# Patient Record
Sex: Female | Born: 1980 | Race: Black or African American | Hispanic: No | Marital: Single | Smoking: Former smoker
Health system: Southern US, Community
[De-identification: ages and names within clinical notes are randomized; demographics above are authoritative.]

## PROBLEM LIST (undated history)

## (undated) ENCOUNTER — Inpatient Hospital Stay (HOSPITAL_COMMUNITY): Payer: Self-pay

## (undated) DIAGNOSIS — R87629 Unspecified abnormal cytological findings in specimens from vagina: Secondary | ICD-10-CM

## (undated) HISTORY — PX: NO PAST SURGERIES: SHX2092

---

## 2002-12-16 ENCOUNTER — Encounter: Payer: Self-pay | Admitting: Emergency Medicine

## 2002-12-16 ENCOUNTER — Emergency Department (HOSPITAL_COMMUNITY): Admission: EM | Admit: 2002-12-16 | Discharge: 2002-12-16 | Payer: Self-pay | Admitting: Emergency Medicine

## 2003-08-19 ENCOUNTER — Encounter: Payer: Self-pay | Admitting: Emergency Medicine

## 2003-08-19 ENCOUNTER — Emergency Department (HOSPITAL_COMMUNITY): Admission: EM | Admit: 2003-08-19 | Discharge: 2003-08-19 | Payer: Self-pay | Admitting: Emergency Medicine

## 2005-01-24 ENCOUNTER — Emergency Department (HOSPITAL_COMMUNITY): Admission: EM | Admit: 2005-01-24 | Discharge: 2005-01-24 | Payer: Self-pay | Admitting: Emergency Medicine

## 2010-11-29 ENCOUNTER — Emergency Department (HOSPITAL_COMMUNITY)
Admission: EM | Admit: 2010-11-29 | Discharge: 2010-11-30 | Payer: Self-pay | Source: Home / Self Care | Admitting: Emergency Medicine

## 2011-06-01 ENCOUNTER — Emergency Department (HOSPITAL_COMMUNITY)
Admission: EM | Admit: 2011-06-01 | Discharge: 2011-06-01 | Discharge status: Against Medical Advice | Payer: Self-pay | Attending: Emergency Medicine | Admitting: Emergency Medicine

## 2013-02-12 LAB — OB RESULTS CONSOLE GBS: GBS: NEGATIVE

## 2013-08-05 LAB — OB RESULTS CONSOLE RPR: RPR: NONREACTIVE

## 2013-08-05 LAB — OB RESULTS CONSOLE ABO/RH: RH Type: POSITIVE

## 2013-08-05 LAB — OB RESULTS CONSOLE GC/CHLAMYDIA
Chlamydia: NEGATIVE
Gonorrhea: NEGATIVE

## 2013-08-05 LAB — OB RESULTS CONSOLE HIV ANTIBODY (ROUTINE TESTING): HIV: NONREACTIVE

## 2013-08-05 LAB — OB RESULTS CONSOLE ANTIBODY SCREEN: Antibody Screen: NEGATIVE

## 2013-08-05 LAB — OB RESULTS CONSOLE HEPATITIS B SURFACE ANTIGEN: Hepatitis B Surface Ag: NEGATIVE

## 2013-08-05 LAB — OB RESULTS CONSOLE RUBELLA ANTIBODY, IGM: Rubella: IMMUNE

## 2013-10-31 NOTE — L&D Delivery Note (Signed)
Delivery Note At 9:09 PM a viable and healthy female was delivered via Vaginal, Spontaneous Delivery (Presentation: Left Occiput Anterior).  APGAR: 8, 9; weight 7 lb 14.3 oz (3580 g).   Placenta status: Intact, Spontaneous.  Not sent Cord: 3 vessels with the following complications: None.  Cord pH: none  Anesthesia: Epidural Local  Episiotomy: None Lacerations: 1st degree;Perineal Suture Repair: 3.0 chromic Est. Blood Loss (mL): 350  Mom to postpartum.  Baby to Couplet care / Skin to Skin.  Keandria Berrocal A Darris Carachure 03/03/2014, 10:03 PM

## 2013-11-30 ENCOUNTER — Inpatient Hospital Stay (HOSPITAL_COMMUNITY)
Admission: AD | Admit: 2013-11-30 | Discharge: 2013-12-01 | Disposition: A | Discharge status: Home / Self Care | Payer: BC Managed Care – PPO | Source: Ambulatory Visit | Attending: Obstetrics and Gynecology | Admitting: Obstetrics and Gynecology

## 2013-11-30 ENCOUNTER — Encounter (HOSPITAL_COMMUNITY): Payer: Self-pay | Admitting: *Deleted

## 2013-11-30 DIAGNOSIS — R110 Nausea: Secondary | ICD-10-CM | POA: Insufficient documentation

## 2013-11-30 DIAGNOSIS — O99891 Other specified diseases and conditions complicating pregnancy: Secondary | ICD-10-CM | POA: Insufficient documentation

## 2013-11-30 DIAGNOSIS — R109 Unspecified abdominal pain: Secondary | ICD-10-CM | POA: Insufficient documentation

## 2013-11-30 DIAGNOSIS — O9989 Other specified diseases and conditions complicating pregnancy, childbirth and the puerperium: Principal | ICD-10-CM

## 2013-11-30 DIAGNOSIS — O26899 Other specified pregnancy related conditions, unspecified trimester: Secondary | ICD-10-CM

## 2013-11-30 HISTORY — DX: Unspecified abnormal cytological findings in specimens from vagina: R87.629

## 2013-11-30 LAB — CBC
HCT: 30.6 % — ABNORMAL LOW (ref 36.0–46.0)
Hemoglobin: 10.8 g/dL — ABNORMAL LOW (ref 12.0–15.0)
MCH: 34.5 pg — ABNORMAL HIGH (ref 26.0–34.0)
MCHC: 35.3 g/dL (ref 30.0–36.0)
MCV: 97.8 fL (ref 78.0–100.0)
Platelets: 286 10*3/uL (ref 150–400)
RBC: 3.13 MIL/uL — ABNORMAL LOW (ref 3.87–5.11)
RDW: 14.5 % (ref 11.5–15.5)
WBC: 10.8 10*3/uL — ABNORMAL HIGH (ref 4.0–10.5)

## 2013-11-30 LAB — URINALYSIS, ROUTINE W REFLEX MICROSCOPIC
Bilirubin Urine: NEGATIVE
Glucose, UA: NEGATIVE mg/dL
Hgb urine dipstick: NEGATIVE
Ketones, ur: NEGATIVE mg/dL
Leukocytes, UA: NEGATIVE
Nitrite: NEGATIVE
Protein, ur: NEGATIVE mg/dL
Specific Gravity, Urine: 1.02 (ref 1.005–1.030)
Urobilinogen, UA: 0.2 mg/dL (ref 0.0–1.0)
pH: 7 (ref 5.0–8.0)

## 2013-11-30 LAB — COMPREHENSIVE METABOLIC PANEL
ALT: 25 U/L (ref 0–35)
AST: 22 U/L (ref 0–37)
Albumin: 2.7 g/dL — ABNORMAL LOW (ref 3.5–5.2)
Alkaline Phosphatase: 73 U/L (ref 39–117)
BUN: 9 mg/dL (ref 6–23)
CO2: 22 mEq/L (ref 19–32)
Calcium: 8.6 mg/dL (ref 8.4–10.5)
Chloride: 103 mEq/L (ref 96–112)
Creatinine, Ser: 0.61 mg/dL (ref 0.50–1.10)
GFR calc Af Amer: >90 mL/min (ref >90)
GFR calc non Af Amer: >90 mL/min (ref >90)
Glucose, Bld: 101 mg/dL — ABNORMAL HIGH (ref 70–99)
Potassium: 4 mEq/L (ref 3.7–5.3)
Sodium: 138 mEq/L (ref 137–147)
Total Bilirubin: <0.2 mg/dL — ABNORMAL LOW (ref 0.3–1.2)
Total Protein: 5.9 g/dL — ABNORMAL LOW (ref 6.0–8.3)

## 2013-11-30 LAB — WET PREP, GENITAL
Trich, Wet Prep: NONE SEEN
Yeast Wet Prep HPF POC: NONE SEEN

## 2013-11-30 MED ORDER — GI COCKTAIL ~~LOC~~
30.0000 mL | Freq: Once | ORAL | Status: AC
  Administered 2013-11-30: 30 mL via ORAL
  Filled 2013-11-30: qty 30

## 2013-11-30 NOTE — MAU Note (Signed)
Pt c/o abd pain from umbilicus up. Pain has been constant for most of the day but now is intermittent. Reports she had had a BM (formed 3-4 times today..Marland Kitchen

## 2013-11-30 NOTE — MAU Provider Note (Signed)
History     CSN: [id number]  Arrival date and time: 11/30/13 2026 Provider notified: 2123 Orders given: 2205 Provider on unit: 2215 Provider at bedside: 2220   Chief Complaint  Patient presents with  . Abdominal Pain   HPI  Ms. Jennifer [name] is a 33 yo G5P3013 at 25.[redacted]wks gestation presenting with complaints of generalized abd pain, nausea and BM x 3 today; which is unusual for her.  She denies diarrhea.  Denies VB or LOF.  Reports  cottage cheese-like vaginal d/c yesterday. No vaginal itching, burning or odor.  (+) FM.  Past Medical History  Diagnosis Date  . Medical history non-contributory   . Vaginal Pap smear, abnormal     Past Surgical History  Procedure Laterality Date  . No past surgeries      No family history on file.  History  Substance Use Topics  . Smoking status: Light Tobacco Smoker  . Smokeless tobacco: Not on file  . Alcohol Use: No    Allergies: No Known Allergies  Prescriptions prior to admission  Medication Sig Dispense Refill  . Prenatal Vit-Fe Fumarate-FA (PRENATAL MULTIVITAMIN) TABS tablet Take 1 tablet by mouth daily at 12 noon.      . promethazine (PHENERGAN) 25 MG tablet Take 25 mg by mouth every 6 (six) hours as needed for nausea or vomiting.        Review of Systems  Constitutional: Negative.   HENT: Negative.   Eyes: Negative.   Respiratory: Negative.   Cardiovascular: Negative.   Gastrointestinal: Positive for nausea and abdominal pain.       BM x 3 episodes today, no diarrhea  Genitourinary: Negative.   Musculoskeletal: Negative.   Skin: Negative.   Neurological: Negative.   Endo/Heme/Allergies: Negative.   Psychiatric/Behavioral: Negative.    Results for orders placed during the hospital encounter of 11/30/13 (from the past 24 hour(s))  URINALYSIS, ROUTINE W REFLEX MICROSCOPIC     Status: None   Collection Time    11/30/13  8:50 PM      Result Value Range   Color, Urine YELLOW  YELLOW   APPearance CLEAR  CLEAR   Specific Gravity, Urine 1.020  1.005 - 1.030   pH 7.0  5.0 - 8.0   Glucose, UA NEGATIVE  NEGATIVE mg/dL   Hgb urine dipstick NEGATIVE  NEGATIVE   Bilirubin Urine NEGATIVE  NEGATIVE   Ketones, ur NEGATIVE  NEGATIVE mg/dL   Protein, ur NEGATIVE  NEGATIVE mg/dL   Urobilinogen, UA 0.2  0.0 - 1.0 mg/dL   Nitrite NEGATIVE  NEGATIVE   Leukocytes, UA NEGATIVE  NEGATIVE  CBC     Status: Abnormal   Collection Time    11/30/13 10:45 PM      Result Value Range   WBC 10.8 (*) 4.0 - 10.5 K/uL   RBC 3.13 (*) 3.87 - 5.11 MIL/uL   Hemoglobin 10.8 (*) 12.0 - 15.0 g/dL   HCT 40.930.6 (*) 81.136.0 - 91.446.0 %   MCV 97.8  78.0 - 100.0 fL   MCH 34.5 (*) 26.0 - 34.0 pg   MCHC 35.3  30.0 - 36.0 g/dL   RDW 78.214.5  95.611.5 - 21.315.5 %   Platelets 286  150 - 400 K/uL  COMPREHENSIVE METABOLIC PANEL     Status: Abnormal   Collection Time    11/30/13 10:45 PM      Result Value Range   Sodium 138  137 - 147 mEq/L   Potassium 4.0  3.7 - 5.3 mEq/L  Chloride 103  96 - 112 mEq/L   CO2 22  19 - 32 mEq/L   Glucose, Bld 101 (*) 70 - 99 mg/dL   BUN 9  6 - 23 mg/dL   Creatinine, Ser 6.96  0.50 - 1.10 mg/dL   Calcium 8.6  8.4 - 29.5 mg/dL   Total Protein 5.9 (*) 6.0 - 8.3 g/dL   Albumin 2.7 (*) 3.5 - 5.2 g/dL   AST 22  0 - 37 U/L   ALT 25  0 - 35 U/L   Alkaline Phosphatase 73  39 - 117 U/L   Total Bilirubin <0.2 (*) 0.3 - 1.2 mg/dL   GFR calc non Af Amer >90  >90 mL/min   GFR calc Af Amer >90  >90 mL/min  WET PREP, GENITAL     Status: Abnormal   Collection Time    11/30/13 10:50 PM      Result Value Range   Yeast Wet Prep HPF POC NONE SEEN  NONE SEEN   Trich, Wet Prep NONE SEEN  NONE SEEN   Clue Cells Wet Prep HPF POC FEW (*) NONE SEEN   WBC, Wet Prep HPF POC FEW (*) NONE SEEN   Physical Exam   Blood pressure 115/60, pulse 95, temperature 98.6 F (37 C), temperature source Oral, resp. rate 18, height 5\' 1"  (1.549 m), weight 84.369 kg (186 lb).  Physical Exam  Constitutional: She is oriented to person, place, and  time. She appears well-developed and well-nourished.  HENT:  Head: Normocephalic and atraumatic.  Eyes: Pupils are equal, round, and reactive to light.  Neck: Normal range of motion. Neck supple.  Cardiovascular: Normal rate, regular rhythm and normal heart sounds.   Respiratory: Effort normal and breath sounds normal.  GI: Soft. Bowel sounds are normal.  Genitourinary: Vagina normal and uterus normal.  Scant white vaginal d/c noted on SSE  Musculoskeletal: Normal range of motion.  Neurological: She is alert and oriented to person, place, and time. She has normal reflexes.  Skin: Skin is warm and dry.  Psychiatric: She has a normal mood and affect. Her behavior is normal. Judgment and thought content normal.  VE: closed/thick/high/posterior  MAU Course  Procedures CCUA CBC CMET GI Cocktail Wet Prep  Assessment and Plan  33 yo IUP @ 25.2wks Abdominal Pain Nausea in Pregnancy Reassuring FHR tracing - appropriate for gestational age Normal CBC, CMET and wet prep Discharge Home Take Phenergan tonight If symptoms worsen or not improved, call the office during business hours  *Dr. Cherly Hensen notified of plan - agrees with plan   Kenard Gower, MSN, CNM 11/30/2013, 11:00 PM

## 2013-12-01 NOTE — Progress Notes (Signed)
Rolitta Dawson CNM in earlier to discuss test results and d/c plan. Written and verbal d/c instructions given and understanding voiced. 

## 2013-12-01 NOTE — Discharge Instructions (Signed)
Maintain good daily hydration.  Eat small, frequent meals every 2-3 hrs.  Take Phenergan tonight to help with nausea and sleep tonight.  If your symptoms have not improved by next week, you will need to call the office during office hours to get a referral to a GI doctor.  Morning Sickness Morning sickness is when you feel sick to your stomach (nauseous) during pregnancy. This nauseous feeling may or may not come with vomiting. It often occurs in the morning but can be a problem any time of day. Morning sickness is most common during the first trimester, but it may continue throughout pregnancy. While morning sickness is unpleasant, it is usually harmless unless you develop severe and continual vomiting (hyperemesis gravidarum). This condition requires more intense treatment.  CAUSES  The cause of morning sickness is not completely known but seems to be related to normal hormonal changes that occur in pregnancy. RISK FACTORS You are at greater risk if you:  Experienced nausea or vomiting before your pregnancy.  Had morning sickness during a previous pregnancy.  Are pregnant with more than one baby, such as twins. TREATMENT  Do not use any medicines (prescription, over-the-counter, or herbal) for morning sickness without first talking to your health care provider. Your health care provider may prescribe or recommend:  Vitamin B6 supplements.  Anti-nausea medicines.  The herbal medicine ginger. HOME CARE INSTRUCTIONS   Only take over-the-counter or prescription medicines as directed by your health care provider.  Taking multivitamins before getting pregnant can prevent or decrease the severity of morning sickness in most women.   Eat a piece of dry toast or unsalted crackers before getting out of bed in the morning.   Eat five or six small meals a day.   Eat dry and bland foods (rice, baked potato). Foods high in carbohydrates are often helpful.  Do not drink liquids with your  meals. Drink liquids between meals.   Avoid greasy, fatty, and spicy foods.   Get someone to cook for you if the smell of any food causes nausea and vomiting.   If you feel nauseous after taking prenatal vitamins, take the vitamins at night or with a snack.  Snack on protein foods (nuts, yogurt, cheese) between meals if you are hungry.   Eat unsweetened gelatins for desserts.   Wearing an acupressure wristband (worn for sea sickness) may be helpful.   Acupuncture may be helpful.   Do not smoke.   Get a humidifier to keep the air in your house free of odors.   Get plenty of fresh air. SEEK MEDICAL CARE IF:   Your home remedies are not working, and you need medicine.  You feel dizzy or lightheaded.  You are losing weight. SEEK IMMEDIATE MEDICAL CARE IF:   You have persistent and uncontrolled nausea and vomiting.  You pass out (faint). Document Released: 12/08/2006 Document Revised: 06/19/2013 Document Reviewed: 04/03/2013 [facility] Patient Information 2014 [place], [place]. Abdominal Pain During Pregnancy Abdominal pain is common in pregnancy. Most of the time, it does not cause harm. There are many causes of abdominal pain. Some causes are more serious than others. Some of the causes of abdominal pain in pregnancy are easily diagnosed. Occasionally, the diagnosis takes time to understand. Other times, the cause is not determined. Abdominal pain can be a sign that something is very wrong with the pregnancy, or the pain may have nothing to do with the pregnancy at all. For this reason, always tell your health care provider if you  have any abdominal discomfort. HOME CARE INSTRUCTIONS  Monitor your abdominal pain for any changes. The following actions may help to alleviate any discomfort you are experiencing:  Do not have sexual intercourse or put anything in your vagina until your symptoms go away completely.  Get plenty of rest until your pain improves.  Drink clear  fluids if you feel nauseous. Avoid solid food as long as you are uncomfortable or nauseous.  Only take over-the-counter or prescription medicine as directed by your health care provider.  Keep all follow-up appointments with your health care provider. SEEK IMMEDIATE MEDICAL CARE IF:  You are bleeding, leaking fluid, or passing tissue from the vagina.  You have increasing pain or cramping.  You have persistent vomiting.  You have painful or bloody urination.  You have a fever.  You notice a decrease in your baby's movements.  You have extreme weakness or feel faint.  You have shortness of breath, with or without abdominal pain.  You develop a severe headache with abdominal pain.  You have abnormal vaginal discharge with abdominal pain.  You have persistent diarrhea.  You have abdominal pain that continues even after rest, or gets worse. MAKE SURE YOU:   Understand these instructions.  Will watch your condition.  Will get help right away if you are not doing well or get worse. Document Released: 10/17/2005 Document Revised: 08/07/2013 Document Reviewed: 05/16/2013 [facility] Patient Information 2014 [place].

## 2014-02-12 LAB — OB RESULTS CONSOLE GBS: GBS: NEGATIVE

## 2014-02-20 ENCOUNTER — Other Ambulatory Visit: Payer: Self-pay | Admitting: Obstetrics and Gynecology

## 2014-02-25 ENCOUNTER — Telehealth (HOSPITAL_COMMUNITY): Payer: Self-pay | Admitting: *Deleted

## 2014-02-25 ENCOUNTER — Encounter (HOSPITAL_COMMUNITY): Payer: Self-pay | Admitting: *Deleted

## 2014-02-25 NOTE — Telephone Encounter (Signed)
Preadmission screen  

## 2014-03-03 ENCOUNTER — Encounter (HOSPITAL_COMMUNITY): Payer: Self-pay | Admitting: *Deleted

## 2014-03-03 ENCOUNTER — Encounter (HOSPITAL_COMMUNITY): Payer: BC Managed Care – PPO | Admitting: Anesthesiology

## 2014-03-03 ENCOUNTER — Inpatient Hospital Stay (HOSPITAL_COMMUNITY)
Admission: AD | Admit: 2014-03-03 | Discharge: 2014-03-05 | DRG: 767 | Disposition: A | Discharge status: Home / Self Care | Payer: BC Managed Care – PPO | Source: Ambulatory Visit | Attending: Obstetrics & Gynecology | Admitting: Obstetrics & Gynecology

## 2014-03-03 ENCOUNTER — Inpatient Hospital Stay (HOSPITAL_COMMUNITY): Payer: BC Managed Care – PPO | Admitting: Anesthesiology

## 2014-03-03 DIAGNOSIS — Z9889 Other specified postprocedural states: Secondary | ICD-10-CM

## 2014-03-03 DIAGNOSIS — O09529 Supervision of elderly multigravida, unspecified trimester: Secondary | ICD-10-CM | POA: Diagnosis present

## 2014-03-03 DIAGNOSIS — E669 Obesity, unspecified: Secondary | ICD-10-CM | POA: Diagnosis present

## 2014-03-03 DIAGNOSIS — Z6837 Body mass index (BMI) 37.0-37.9, adult: Secondary | ICD-10-CM

## 2014-03-03 DIAGNOSIS — Z87891 Personal history of nicotine dependence: Secondary | ICD-10-CM

## 2014-03-03 DIAGNOSIS — Z823 Family history of stroke: Secondary | ICD-10-CM

## 2014-03-03 DIAGNOSIS — Z8249 Family history of ischemic heart disease and other diseases of the circulatory system: Secondary | ICD-10-CM

## 2014-03-03 DIAGNOSIS — Z302 Encounter for sterilization: Secondary | ICD-10-CM

## 2014-03-03 DIAGNOSIS — O99214 Obesity complicating childbirth: Secondary | ICD-10-CM

## 2014-03-03 LAB — RPR

## 2014-03-03 LAB — ABO/RH: ABO/RH(D): A POS

## 2014-03-03 LAB — CBC
HCT: 36.3 % (ref 36.0–46.0)
Hemoglobin: 12.8 g/dL (ref 12.0–15.0)
MCH: 34.5 pg — ABNORMAL HIGH (ref 26.0–34.0)
MCHC: 35.3 g/dL (ref 30.0–36.0)
MCV: 97.8 fL (ref 78.0–100.0)
Platelets: 248 10*3/uL (ref 150–400)
RBC: 3.71 MIL/uL — ABNORMAL LOW (ref 3.87–5.11)
RDW: 15.1 % (ref 11.5–15.5)
WBC: 8.2 10*3/uL (ref 4.0–10.5)

## 2014-03-03 LAB — TYPE AND SCREEN
ABO/RH(D): A POS
Antibody Screen: NEGATIVE

## 2014-03-03 MED ORDER — FERROUS SULFATE 325 (65 FE) MG PO TABS
325.0000 mg | ORAL_TABLET | Freq: Two times a day (BID) | ORAL | Status: DC
  Administered 2014-03-04 (×2): 325 mg via ORAL
  Filled 2014-03-03 (×2): qty 1

## 2014-03-03 MED ORDER — ACETAMINOPHEN 325 MG PO TABS: 650.0000 mg | ORAL_TABLET | ORAL | Status: DC | PRN

## 2014-03-03 MED ORDER — FENTANYL 2.5 MCG/ML BUPIVACAINE 1/10 % EPIDURAL INFUSION (WH - ANES)
14.0000 mL/h | INTRAMUSCULAR | Status: DC | PRN
  Administered 2014-03-03 (×2): 14 mL/h via EPIDURAL
  Filled 2014-03-03: qty 125

## 2014-03-03 MED ORDER — SIMETHICONE 80 MG PO CHEW: 80.0000 mg | CHEWABLE_TABLET | ORAL | Status: DC | PRN

## 2014-03-03 MED ORDER — LIDOCAINE HCL (PF) 1 % IJ SOLN
30.0000 mL | INTRAMUSCULAR | Status: DC | PRN
  Administered 2014-03-03: 30 mL via SUBCUTANEOUS
  Filled 2014-03-03 (×2): qty 30

## 2014-03-03 MED ORDER — FENTANYL 2.5 MCG/ML BUPIVACAINE 1/10 % EPIDURAL INFUSION (WH - ANES): 14.0000 mL/h | INTRAMUSCULAR | Status: DC | PRN

## 2014-03-03 MED ORDER — PHENYLEPHRINE 40 MCG/ML (10ML) SYRINGE FOR IV PUSH (FOR BLOOD PRESSURE SUPPORT)
80.0000 ug | PREFILLED_SYRINGE | INTRAVENOUS | Status: DC | PRN
  Administered 2014-03-03: 80 ug via INTRAVENOUS
  Filled 2014-03-03: qty 10
  Filled 2014-03-03: qty 2

## 2014-03-03 MED ORDER — OXYCODONE-ACETAMINOPHEN 5-325 MG PO TABS: 1.0000 | ORAL_TABLET | ORAL | Status: DC | PRN

## 2014-03-03 MED ORDER — SENNOSIDES-DOCUSATE SODIUM 8.6-50 MG PO TABS
2.0000 | ORAL_TABLET | ORAL | Status: DC
  Administered 2014-03-04 – 2014-03-05 (×2): 2 via ORAL
  Filled 2014-03-03 (×2): qty 2

## 2014-03-03 MED ORDER — CITRIC ACID-SODIUM CITRATE 334-500 MG/5ML PO SOLN: 30.0000 mL | ORAL | Status: DC | PRN

## 2014-03-03 MED ORDER — EPHEDRINE 5 MG/ML INJ
10.0000 mg | INTRAVENOUS | Status: DC | PRN
  Filled 2014-03-03: qty 2

## 2014-03-03 MED ORDER — PRENATAL MULTIVITAMIN CH: 1.0000 | ORAL_TABLET | Freq: Every day | ORAL | Status: DC

## 2014-03-03 MED ORDER — SODIUM BICARBONATE 8.4 % IV SOLN
INTRAVENOUS | Status: DC | PRN
  Administered 2014-03-03: 5 mL via EPIDURAL

## 2014-03-03 MED ORDER — TERBUTALINE SULFATE 1 MG/ML IJ SOLN: 0.2500 mg | Freq: Once | INTRAMUSCULAR | Status: DC | PRN

## 2014-03-03 MED ORDER — WITCH HAZEL-GLYCERIN EX PADS: 1.0000 "application " | MEDICATED_PAD | CUTANEOUS | Status: DC | PRN

## 2014-03-03 MED ORDER — EPHEDRINE 5 MG/ML INJ
INTRAVENOUS | Status: AC
  Filled 2014-03-03: qty 4

## 2014-03-03 MED ORDER — IBUPROFEN 600 MG PO TABS: 600.0000 mg | ORAL_TABLET | Freq: Four times a day (QID) | ORAL | Status: DC | PRN

## 2014-03-03 MED ORDER — PHENYLEPHRINE 40 MCG/ML (10ML) SYRINGE FOR IV PUSH (FOR BLOOD PRESSURE SUPPORT)
80.0000 ug | PREFILLED_SYRINGE | INTRAVENOUS | Status: DC | PRN
  Administered 2014-03-03: 80 ug via INTRAVENOUS
  Filled 2014-03-03: qty 2

## 2014-03-03 MED ORDER — OXYTOCIN BOLUS FROM INFUSION
500.0000 mL | INTRAVENOUS | Status: DC
  Administered 2014-03-03: 500 mL via INTRAVENOUS

## 2014-03-03 MED ORDER — PHENYLEPHRINE 40 MCG/ML (10ML) SYRINGE FOR IV PUSH (FOR BLOOD PRESSURE SUPPORT)
PREFILLED_SYRINGE | INTRAVENOUS | Status: AC
  Filled 2014-03-03: qty 10

## 2014-03-03 MED ORDER — BENZOCAINE-MENTHOL 20-0.5 % EX AERO: 1.0000 "application " | INHALATION_SPRAY | CUTANEOUS | Status: DC | PRN

## 2014-03-03 MED ORDER — ZOLPIDEM TARTRATE 5 MG PO TABS: 5.0000 mg | ORAL_TABLET | Freq: Every evening | ORAL | Status: DC | PRN

## 2014-03-03 MED ORDER — OXYTOCIN 40 UNITS IN LACTATED RINGERS INFUSION - SIMPLE MED
1.0000 m[IU]/min | INTRAVENOUS | Status: DC
  Administered 2014-03-03: 2 m[IU]/min via INTRAVENOUS

## 2014-03-03 MED ORDER — LACTATED RINGERS IV SOLN
INTRAVENOUS | Status: DC
  Administered 2014-03-03 (×3): via INTRAVENOUS

## 2014-03-03 MED ORDER — LANOLIN HYDROUS EX OINT: TOPICAL_OINTMENT | CUTANEOUS | Status: DC | PRN

## 2014-03-03 MED ORDER — LACTATED RINGERS IV SOLN: 500.0000 mL | Freq: Once | INTRAVENOUS | Status: DC

## 2014-03-03 MED ORDER — LACTATED RINGERS IV SOLN
INTRAVENOUS | Status: DC
  Administered 2014-03-03: 19:00:00 via INTRAUTERINE

## 2014-03-03 MED ORDER — FENTANYL 2.5 MCG/ML BUPIVACAINE 1/10 % EPIDURAL INFUSION (WH - ANES)
INTRAMUSCULAR | Status: AC
  Administered 2014-03-03: 14 mL/h via EPIDURAL
  Filled 2014-03-03: qty 125

## 2014-03-03 MED ORDER — ONDANSETRON HCL 4 MG/2ML IJ SOLN: 4.0000 mg | INTRAMUSCULAR | Status: DC | PRN

## 2014-03-03 MED ORDER — DIPHENHYDRAMINE HCL 50 MG/ML IJ SOLN: 12.5000 mg | INTRAMUSCULAR | Status: DC | PRN

## 2014-03-03 MED ORDER — DEXTROSE IN LACTATED RINGERS 5 % IV SOLN
INTRAVENOUS | Status: DC
  Administered 2014-03-04 (×2): via INTRAVENOUS

## 2014-03-03 MED ORDER — IBUPROFEN 600 MG PO TABS
600.0000 mg | ORAL_TABLET | Freq: Four times a day (QID) | ORAL | Status: DC
  Administered 2014-03-04 – 2014-03-05 (×6): 600 mg via ORAL
  Filled 2014-03-03 (×7): qty 1

## 2014-03-03 MED ORDER — ONDANSETRON HCL 4 MG PO TABS: 4.0000 mg | ORAL_TABLET | ORAL | Status: DC | PRN

## 2014-03-03 MED ORDER — OXYTOCIN 40 UNITS IN LACTATED RINGERS INFUSION - SIMPLE MED
62.5000 mL/h | INTRAVENOUS | Status: DC
  Filled 2014-03-03: qty 1000

## 2014-03-03 MED ORDER — DIBUCAINE 1 % RE OINT: 1.0000 "application " | TOPICAL_OINTMENT | RECTAL | Status: DC | PRN

## 2014-03-03 MED ORDER — DIPHENHYDRAMINE HCL 25 MG PO CAPS: 25.0000 mg | ORAL_CAPSULE | Freq: Four times a day (QID) | ORAL | Status: DC | PRN

## 2014-03-03 MED ORDER — LACTATED RINGERS IV SOLN
500.0000 mL | INTRAVENOUS | Status: DC | PRN
  Administered 2014-03-03: 1000 mL via INTRAVENOUS

## 2014-03-03 NOTE — MAU Note (Signed)
Patient states she is having frequent contractions. Denies bleeding or leaking and reports good fetal movement.

## 2014-03-03 NOTE — H&P (Signed)
  OB ADMISSION/ HISTORY & PHYSICAL:  Admission Date: 03/03/2014  8:36 AM  Admit Diagnosis: 38.[redacted] weeks pregnant / Normal labor and delivery  Jennifer [name] is a 33 y.o. female G5P3 BF @ 38 4/[redacted] weeks gestation presenting for onset of labor. Uncomplicated pregnancy  Prenatal History: [id number]   EDC : 03/13/2014, by Other Basis  [facility]  Primary Ob Provider: Dr Cherly Hensenousins Prenatal course uncomplicated  Prenatal Labs: ABO, Rh: A/Positive/-- (10/06 0000) Antibody: Negative (10/06 0000) Rubella: Immune (10/06 0000)  RPR: Nonreactive (10/06 0000)  HBsAg: Negative (10/06 0000)  HIV: Non-reactive (10/06 0000)  GTT: NL GBS: Negative (04/15 0000)   Medical / Surgical History :  Past medical history:  Past Medical History  Diagnosis Date  . Vaginal Pap smear, abnormal      Past surgical history:  Past Surgical History  Procedure Laterality Date  . No past surgeries      Family History:  Family History  Problem Relation Age of Onset  . Stroke Paternal Uncle   . Heart disease Paternal Uncle   . Hypertension Paternal Grandmother      Social History:  reports that she quit smoking about 5 months ago. She has never used smokeless tobacco. She reports that she does not drink alcohol or use illicit drugs.   Allergies: Latex    Current Medications at time of admission:  Prior to Admission medications   Medication Sig Start Date End Date Taking? Authorizing Provider  Prenatal Vit-Fe Fumarate-FA (PRENATAL MULTIVITAMIN) TABS tablet Take 1 tablet by mouth daily at 12 noon.    Historical Provider, MD  promethazine (PHENERGAN) 25 MG tablet Take 25 mg by mouth every 6 (six) hours as needed for nausea or vomiting.    Historical Provider, MD   Review of Systems: Active FM onset of ctx this AM - every 5 minutes and painful No LOF  / SROM bloody show present   Physical Exam:  VS: Blood pressure 149/94, pulse 95, temperature 98.1 F (36.7 C),  temperature source Oral, resp. rate 20, height 4' 11.75" (1.518 m), weight 86.818 kg (191 lb 6.4 oz).  General: alert and oriented, appears uncomfortable with ctx en route to BS via WC HEENT anicteric sclera, pink conjuntiva oropharnyx neg Cor RRR Lungs clear to A  Abd gravid nontender  Genitalia / VE: Dilation: 5.5 Effacement (%): 90 Station: -2 Exam by:: Dellie BurnsScarlett Murray, RN BSN  FHR: baseline rate 135 / variability moderate / accelerations 10x10 / no decelerations TOCO: ctx every 3-5 minutes CBC    Component Value Date/Time   WBC 8.2 03/03/2014 0941   RBC 3.71* 03/03/2014 0941   HGB 12.8 03/03/2014 0941   HCT 36.3 03/03/2014 0941   PLT 248 03/03/2014 0941   MCV 97.8 03/03/2014 0941   MCH 34.5* 03/03/2014 0941   MCHC 35.3 03/03/2014 0941   RDW 15.1 03/03/2014 0941     Assessment: 38.[redacted] weeks gestation active stage of labor FHR category 1  Plan:  Admit Epidural prn, routine labs, amniotomy prn  Dr Wonda Oldsousin notified of admission / plan of care - MD to follow Marlinda Mikeanya Bailey CNM, MSN, [facility] 03/03/2014, 9:31 AM

## 2014-03-03 NOTE — Progress Notes (Signed)
Jennifer BealLakisha Montez [name] is a 33 y.o. [id number] at 3665w4d by ultrasound admitted for active labor  Subjective: Chief Complaint  Patient presents with  . Labor Eval   C/o some vaginal pressure Objective: BP 169/93  Pulse 117  Temp(Src) 98.4 F (36.9 C) (Oral)  Resp 20  Ht 4' 11.75" (1.518 m)  Wt 86.818 kg (191 lb 6.4 oz)  BMI 37.68 kg/m2  SpO2 100%   Total I/O In: -  Out: 350 [Urine:350]  FHT:  FHR: 135 bpm, variability: moderate,  accelerations:  Present,  decelerations:  Absent UC:   regular, every 2-4 minutes SVE:   8-9 cm dilated, 90% effaced, 0/+1 station asynclytic AROM moderate meconium. IUPC placed Tracing: cat 1  Labs: Lab Results  Component Value Date   WBC 8.2 03/03/2014   HGB 12.8 03/03/2014   HCT 36.3 03/03/2014   MCV 97.8 03/03/2014   PLT 248 03/03/2014    Assessment / Plan: Protracted active phase Multiparous P) amnioinfusion. Start pitocin. Exaggerated right Sims position using peanut   Anticipated MOD:  NSVD  Jennifer [name] 03/03/2014, 6:40 PM

## 2014-03-03 NOTE — Anesthesia Procedure Notes (Signed)

## 2014-03-03 NOTE — Progress Notes (Signed)
Pt  Is s/p uncomplicated vaginal delivery. Desires permanent sterilization. Disc permanence, nonreversible, failure rate 1/300 vs interval TL ( 1/500-1/600). Alternative Essure disc. Pt desires PPTL prior to discharge. Consent signed. Dr Seymour Barslavoie to perform surgery if feasible in am. NPO after midnight

## 2014-03-03 NOTE — Progress Notes (Signed)
S: more comfortable  O: Epidural VE: 6/90/0 per RN Tracing: baseline 135 (+) accels Ctx q2-4 mins  IMP: Active phase P)    Cont mgmt.

## 2014-03-03 NOTE — Progress Notes (Signed)
Delivery of live viable female at 2109 by Dr. Nelta NumbersS. Cousins.

## 2014-03-03 NOTE — Anesthesia Preprocedure Evaluation (Signed)
Anesthesia Evaluation  Patient identified by MRN, date of birth, ID band Patient awake    Reviewed: Allergy & Precautions, H&P , Patient's Chart, lab work & pertinent test results, reviewed documented beta blocker date and time   Airway Mallampati: II TM Distance: >3 FB Neck ROM: full    Dental no notable dental hx. (+) Teeth Intact   Pulmonary former smoker,  breath sounds clear to auscultation  Pulmonary exam normal       Cardiovascular Rhythm:regular Rate:Normal     Neuro/Psych    GI/Hepatic   Endo/Other  Morbid obesity  Renal/GU      Musculoskeletal   Abdominal   Peds  Hematology   Anesthesia Other Findings       Reproductive/Obstetrics (+) Pregnancy                           Anesthesia Physical Anesthesia Plan  ASA: III  Anesthesia Plan: Epidural   Post-op Pain Management:    Induction:   Airway Management Planned:   Additional Equipment:   Intra-op Plan:   Post-operative Plan:   Informed Consent: I have reviewed the patients History and Physical, chart, labs and discussed the procedure including the risks, benefits and alternatives for the proposed anesthesia with the patient or authorized representative who has indicated his/her understanding and acceptance.   Dental Advisory Given  Plan Discussed with: CRNA and Surgeon  Anesthesia Plan Comments: (Labs checked- platelets confirmed with RN in room. Fetal heart tracing, per RN, reported to be stable enough for sitting procedure. Discussed epidural, and patient consents to the procedure:  included risk of possible headache,backache, failed block, allergic reaction, and nerve injury. This patient was asked if she had any questions or concerns before the procedure started.)        Anesthesia Quick Evaluation

## 2014-03-04 ENCOUNTER — Encounter (HOSPITAL_COMMUNITY): Payer: BC Managed Care – PPO

## 2014-03-04 ENCOUNTER — Encounter (HOSPITAL_COMMUNITY)
Admission: AD | Disposition: A | Discharge status: Home / Self Care | Payer: Self-pay | Source: Ambulatory Visit | Attending: Obstetrics and Gynecology

## 2014-03-04 ENCOUNTER — Inpatient Hospital Stay (HOSPITAL_COMMUNITY): Payer: BC Managed Care – PPO

## 2014-03-04 ENCOUNTER — Encounter (HOSPITAL_COMMUNITY): Payer: Self-pay

## 2014-03-04 DIAGNOSIS — Z9889 Other specified postprocedural states: Secondary | ICD-10-CM

## 2014-03-04 HISTORY — PX: TUBAL LIGATION: SHX77

## 2014-03-04 LAB — SURGICAL PCR SCREEN
MRSA, PCR: NEGATIVE
Staphylococcus aureus: NEGATIVE

## 2014-03-04 LAB — CBC
HCT: 28.9 % — ABNORMAL LOW (ref 36.0–46.0)
Hemoglobin: 10 g/dL — ABNORMAL LOW (ref 12.0–15.0)
MCH: 34.1 pg — ABNORMAL HIGH (ref 26.0–34.0)
MCHC: 34.6 g/dL (ref 30.0–36.0)
MCV: 98.6 fL (ref 78.0–100.0)
Platelets: 201 10*3/uL (ref 150–400)
RBC: 2.93 MIL/uL — ABNORMAL LOW (ref 3.87–5.11)
RDW: 15 % (ref 11.5–15.5)
WBC: 17.8 10*3/uL — ABNORMAL HIGH (ref 4.0–10.5)

## 2014-03-04 SURGERY — LIGATION, FALLOPIAN TUBE, POSTPARTUM
Anesthesia: Epidural | Site: Abdomen | Laterality: Bilateral

## 2014-03-04 SURGERY — LIGATION, FALLOPIAN TUBE, POSTPARTUM
Anesthesia: Choice | Laterality: Bilateral

## 2014-03-04 MED ORDER — BUPIVACAINE HCL (PF) 0.25 % IJ SOLN
INTRAMUSCULAR | Status: DC | PRN
  Administered 2014-03-04: 30 mL

## 2014-03-04 MED ORDER — LIDOCAINE-EPINEPHRINE (PF) 2 %-1:200000 IJ SOLN
INTRAMUSCULAR | Status: AC
  Filled 2014-03-04: qty 20

## 2014-03-04 MED ORDER — MIDAZOLAM HCL 2 MG/2ML IJ SOLN
INTRAMUSCULAR | Status: AC
  Filled 2014-03-04: qty 2

## 2014-03-04 MED ORDER — LACTATED RINGERS IV SOLN
INTRAVENOUS | Status: DC | PRN
  Administered 2014-03-04: 12:00:00 via INTRAVENOUS

## 2014-03-04 MED ORDER — LACTATED RINGERS IV SOLN
Freq: Once | INTRAVENOUS | Status: AC
  Administered 2014-03-04: 15:00:00 via INTRAVENOUS

## 2014-03-04 MED ORDER — SODIUM BICARBONATE 8.4 % IV SOLN
INTRAVENOUS | Status: AC
  Filled 2014-03-04: qty 50

## 2014-03-04 MED ORDER — FAMOTIDINE 20 MG PO TABS
40.0000 mg | ORAL_TABLET | Freq: Once | ORAL | Status: AC
  Administered 2014-03-04: 40 mg via ORAL
  Filled 2014-03-04: qty 2

## 2014-03-04 MED ORDER — SODIUM BICARBONATE 8.4 % IV SOLN
INTRAVENOUS | Status: DC | PRN
  Administered 2014-03-04: 3 mL via EPIDURAL
  Administered 2014-03-04: 2 mL via EPIDURAL
  Administered 2014-03-04 (×2): 5 mL via EPIDURAL

## 2014-03-04 MED ORDER — METOCLOPRAMIDE HCL 10 MG PO TABS
10.0000 mg | ORAL_TABLET | Freq: Once | ORAL | Status: AC
  Administered 2014-03-04: 10 mg via ORAL
  Filled 2014-03-04: qty 1

## 2014-03-04 MED ORDER — MORPHINE SULFATE 4 MG/ML IJ SOLN: 1.0000 mg | INTRAMUSCULAR | Status: DC | PRN

## 2014-03-04 SURGICAL SUPPLY — 25 items
ADH SKN CLS APL DERMABOND .7 (GAUZE/BANDAGES/DRESSINGS) ×1
CLOTH BEACON ORANGE TIMEOUT ST (SAFETY) ×2 IMPLANT
CONTAINER PREFILL 10% NBF 15ML (MISCELLANEOUS) ×4 IMPLANT
DERMABOND ADVANCED (GAUZE/BANDAGES/DRESSINGS) ×1
DERMABOND ADVANCED .7 DNX12 (GAUZE/BANDAGES/DRESSINGS) IMPLANT
ELECT REM PT RETURN 9FT ADLT (ELECTROSURGICAL) ×2
ELECTRODE REM PT RTRN 9FT ADLT (ELECTROSURGICAL) ×1 IMPLANT
GLOVE BIO SURGEON STRL SZ 6.5 (GLOVE) ×2 IMPLANT
GLOVE BIOGEL PI IND STRL 7.0 (GLOVE) ×1 IMPLANT
GLOVE BIOGEL PI INDICATOR 7.0 (GLOVE) ×1
GOWN STRL REUS W/TWL LRG LVL3 (GOWN DISPOSABLE) ×4 IMPLANT
NDL HYPO 25X1 1.5 SAFETY (NEEDLE) ×1 IMPLANT
NEEDLE HYPO 25X1 1.5 SAFETY (NEEDLE) ×2 IMPLANT
PACK ABDOMINAL MINOR (CUSTOM PROCEDURE TRAY) ×2 IMPLANT
PENCIL BUTTON HOLSTER BLD 10FT (ELECTRODE) ×2 IMPLANT
SPONGE LAP 4X18 X RAY DECT (DISPOSABLE) ×1 IMPLANT
STRIP CLOSURE SKIN 1/4X3 (GAUZE/BANDAGES/DRESSINGS) ×2 IMPLANT
SUT PLAIN 0 NONE (SUTURE) ×2 IMPLANT
SUT VIC AB 3-0 PS2 18 (SUTURE) ×2
SUT VIC AB 3-0 PS2 18XBRD (SUTURE) ×1 IMPLANT
SUT VICRYL 0 UR6 27IN ABS (SUTURE) ×3 IMPLANT
SYR CONTROL 10ML LL (SYRINGE) ×2 IMPLANT
TOWEL OR 17X24 6PK STRL BLUE (TOWEL DISPOSABLE) ×4 IMPLANT
TRAY FOLEY CATH 14FR (SET/KITS/TRAYS/PACK) ×2 IMPLANT
WATER STERILE IRR 1000ML POUR (IV SOLUTION) ×4 IMPLANT

## 2014-03-04 NOTE — Anesthesia Postprocedure Evaluation (Signed)
  Anesthesia Post-op Note  Patient: Jennifer [name]  Procedure(s) Performed: Procedure(s): POST PARTUM TUBAL LIGATION (Bilateral) Patient is awake and responsive. Pain and nausea are reasonably well controlled. Vital signs are stable and clinically acceptable. Oxygen saturation is clinically acceptable. There are no apparent anesthetic complications at this time. Patient is ready for discharge.

## 2014-03-04 NOTE — Anesthesia Preprocedure Evaluation (Signed)
Anesthesia Evaluation  Patient identified by MRN, date of birth, ID band Patient awake    Reviewed: Allergy & Precautions, H&P , Patient's Chart, lab work & pertinent test results, reviewed documented beta blocker date and time   Airway Mallampati: II TM Distance: >3 FB Neck ROM: full    Dental no notable dental hx. (+) Teeth Intact   Pulmonary former smoker,  breath sounds clear to auscultation  Pulmonary exam normal       Cardiovascular Rhythm:regular Rate:Normal     Neuro/Psych    GI/Hepatic   Endo/Other  Morbid obesity  Renal/GU      Musculoskeletal   Abdominal   Peds  Hematology   Anesthesia Other Findings       Reproductive/Obstetrics Desires permanent contraception                           Anesthesia Physical  Anesthesia Plan  ASA: III  Anesthesia Plan: Epidural   Post-op Pain Management:    Induction:   Airway Management Planned: Natural Airway  Additional Equipment:   Intra-op Plan:   Post-operative Plan:   Informed Consent: I have reviewed the patients History and Physical, chart, labs and discussed the procedure including the risks, benefits and alternatives for the proposed anesthesia with the patient or authorized representative who has indicated his/her understanding and acceptance.   Dental Advisory Given  Plan Discussed with: CRNA, Surgeon and Anesthesiologist  Anesthesia Plan Comments:         Anesthesia Quick Evaluation

## 2014-03-04 NOTE — Progress Notes (Signed)
PPD 1 SVD  S:  Reports feeling well - awaiting BTL surgery at lunch today             Tolerating po/ No nausea or vomiting             Bleeding is light             Pain controlled with motrin             Up ad lib / ambulatory / voiding QS  Newborn breast feeding   O:               VS: BP 120/60  Pulse 83  Temp(Src) 98.1 F (36.7 C) (Oral)  Resp 18  Ht 4' 11.75" (1.518 m)  Wt 86.818 kg (191 lb 6.4 oz)  BMI 37.68 kg/m2  SpO2 100%  Breastfeeding? Unknown   LABS:              Recent Labs  03/03/14 0941 03/04/14 0610  WBC 8.2 17.8*  HGB 12.8 10.0*  PLT 248 201               Blood type: --/--/A POS, A POS (05/04 1110)  Rubella: Immune (10/06 0000)                                Physical Exam:             Alert and oriented X3  Lungs: Clear and unlabored  Heart: regular rate and rhythm / no mumurs  Abdomen: soft, non-tender, non-distended              Fundus: firm, non-tender, Ueven  Perineum: no edema  Lochia: light  Extremities: trace edema, no calf pain or tenderness    A: PPD # 1   Doing well - stable status             Undesired fertility  P: Routine post partum orders  BTL as scheduled today             Anticipate DC tomorrow  Marlinda Mikeanya Nashayla Telleria CNM, MSN, [facility] 03/04/2014, 11:27 AM

## 2014-03-04 NOTE — Op Note (Signed)
03/03/2014 - 03/04/2014  1:06 PM  PATIENT:  Nunzio CobbsLakisha Chacko  33 y.o. female  PRE-OPERATIVE DIAGNOSIS:  Desires Sterilization  POST-OPERATIVE DIAGNOSIS:  Desires Sterilization  PROCEDURE:  Procedure(s): POST PARTUM BILATERAL TUBAL LIGATION BY MODIFIED POMEROY TECHNIQUE  SURGEON:  Surgeon(s): Genia DelMarie-Lyne Brooklynn Brandenburg, MD  ASSISTANTS: none   ANESTHESIA:   epidural  PROCEDURE:  Under general anesthesia, the patient is in dorsal decubitus position, she is on prepped with Betadine on the abdomen.  She is draped as usual.   The subcutaneous tissue was infiltrated with Marcaine one quarter plain at the infraumbilical area.  A 2 cm incision is made with a scalpel and that level.  Aponeurosis was grasped with Coker clamps. The aponeurosis is opened with Mayo scissors. The peritoneum is opened with Mayo scissors as well.  We used Navy retractors to locate the right tube.  The patient was rotated to the left facilitate the location of the right tube.  A lap on a hemostat was also used to remove the omentum from the visual field. The right tube was grasped with a Babcock.  This was followed with another Tanja PortBabcock to identify the fimbria.  Then make a window with the Bovie in the mesosalpinx.  We used plain 2-0 to suture the distal part of the right tube. We then sutured the proximal part of the tube. Resection of a small portion in between which is sent to pathology.  We cauterized each end with the Bovie.  Hemostasis is adequate.  We proceed exactly the same way on the left tube.  Hemostasis was adequate.  Removed the lap.  We closed the aponeurosis with figures of 8 with Vicryl 0.  Hemostasis was adequate on the adipose tissue. We closed the skin with a subcuticular stitch of Vicryl 3-0. Dermabond was added.  The patient was brought to recovery room in good and stable status.  ESTIMATED BLOOD LOSS:  5 cc   Intake/Output Summary (Last 24 hours) at 03/04/14 1306 Last data filed at 03/04/14 1215  Gross per 24 hour   Intake      0 ml  Output   1105 ml  Net  -1105 ml     BLOOD ADMINISTERED:none   LOCAL MEDICATIONS USED:  MARCAINE     SPECIMEN:  Source of Specimen:  Right and Left portions of tubes  DISPOSITION OF SPECIMEN:  PATHOLOGY  COUNTS:  YES  PLAN OF CARE: Transfer to PACU  Genia DelMarie-Lyne Gaynel Schaafsma MD  03/04/2014 at 1:06 pm

## 2014-03-04 NOTE — Transfer of Care (Signed)
Immediate Anesthesia Transfer of Care Note  Patient: Jennifer [name]  Procedure(s) Performed: Procedure(s): POST PARTUM TUBAL LIGATION (Bilateral)  Patient Location: PACU  Anesthesia Type:Epidural  Level of Consciousness: awake and sedated  Airway & Oxygen Therapy: Patient Spontanous Breathing  Post-op Assessment: Report given to PACU RN and Post -op Vital signs reviewed and stable  Post vital signs: Reviewed and stable  Complications: No apparent anesthesia complications

## 2014-03-04 NOTE — Anesthesia Postprocedure Evaluation (Signed)
Anesthesia Post Note  Patient: Jennifer CobbsLakisha Deakins  Procedure(s) Performed: * No procedures listed *  Anesthesia type: Epidural  Patient location: Mother/Baby  Post pain: Pain level controlled  Post assessment: Post-op Vital signs reviewed  Last Vitals:  Filed Vitals:   03/04/14 0426  BP: 125/59  Pulse: 83  Temp: 37 C  Resp: 18    Post vital signs: Reviewed  Level of consciousness: awake  Complications: No apparent anesthesia complications

## 2014-03-04 NOTE — Progress Notes (Signed)

## 2014-03-04 NOTE — Lactation Note (Addendum)
This note was copied from the chart of Jennifer Nunzio CobbsLakisha Housh. Lactation Consultation Note  Patient Name: Jennifer [name] ZOXWR'UToday's Date: 03/04/2014 Reason for consult: Other (Comment) (charting for exclusion) Initial assessment, baby 20 hours of age. Baby has not eaten since early am 03/04/14. Mom had tubal ligation. Mom asked for assistance with BF. Mom able to latch baby in football hold on right breast. Right nipple everts, but mom states she has trouble latching baby to left breast. Nipple on left breast is flat, but everts with stimulation. Enc mom to nurse baby more often and to provide lots of STS. Mom is not sure if she really wants to nurse or not. Enc mom to make sure baby is fed. Discussed with mom that breast milk is produced when it is demanded by baby so baby must go to breast in order to have breast milk. Enc mom to feed 8-12 times per day when baby shows feeding cues, and to feed more often through the night since baby has not been nursing at breast very often since just past midnight last night. Mom states that she has seen colostrum, but is timid about hand expressing her own breast. LC did not see colostrum during hand expression. Enc mom to call out for assistance as needed. [facility], aware of OP/BFSG and community services.   Maternal Data Formula Feeding for Exclusion: Yes Reason for exclusion: Mother's choice to formula and breast feed on admission Infant to breast within first hour of birth: Yes (initial LATCH score=9 and baby breastfed for 20 minutes)  Feeding Feeding Type: Breast Fed (LC assessed first 15 min.s of BF.)  LATCH Score/Interventions Latch: Grasps breast easily, tongue down, lips flanged, rhythmical sucking. (Baby has trouble latching onto left breast, nipple flatter to start with, everts with stimulation. Right nipple everted.)  Audible Swallowing: None  Type of Nipple: Flat (Left nipple flat, right nipple everted.)  Comfort (Breast/Nipple):  Soft / non-tender     Hold (Positioning): Assistance needed to correctly position infant at breast and maintain latch.  LATCH Score: 6  Lactation Tools Discussed/Used     Consult Status Consult Status: Follow-up Follow-up type: In-patient    Sherlyn HayJennifer D Konnor Jorden 03/04/2014, 5:31 PM

## 2014-03-05 ENCOUNTER — Encounter (HOSPITAL_COMMUNITY): Payer: Self-pay | Admitting: Obstetrics & Gynecology

## 2014-03-05 MED ORDER — IBUPROFEN 600 MG PO TABS: 600.0000 mg | ORAL_TABLET | Freq: Four times a day (QID) | ORAL | Status: AC

## 2014-03-05 NOTE — Progress Notes (Addendum)
Patient ID: Jennifer [name], female   DOB: 30-Apr-1981, 33 y.o.   MRN: [id number] Post Partum Day #2            Information for the patient's newborn:  Sheran SpineCarter, Girl Daphne [[id number]][[id number]]  female   Feeding: breast  Subjective: No HA, SOB, CP, F/C, breast symptoms. Pain well-controlled with Ibuprofen. Normal vaginal bleeding, no clots.      Objective:  Temp:  [98 F (36.7 C)-99.1 F (37.3 C)] 98 F (36.7 C) (05/06 0515) Pulse Rate:  [80-108] 81 (05/06 0515) Resp:  [10-22] 18 (05/06 0515) BP: (93-138)/(44-88) 130/85 mmHg (05/06 0515) SpO2:  [99 %-100 %] 100 % (05/05 1635)   Intake/Output Summary (Last 24 hours) at 03/05/14 0918 Last data filed at 03/04/14 1515  Gross per 24 hour  Intake   1000 ml  Output    305 ml  Net    695 ml       Recent Labs  03/03/14 0941 03/04/14 0610  WBC 8.2 17.8*  HGB 12.8 10.0*  HCT 36.3 28.9*  PLT 248 201    Blood type: --/--/A POS, A POS (05/04 1110) Rubella: Immune (10/06 0000)    Physical Exam:  General: alert, cooperative and no distress Uterine Fundus: firm, midline, U-2 Umbilicus: skin well-approximated with Dermabond, mildly tender with palpation Lochia: appropriate Perineum: 2nd degree repair healing well, no edema  DVT Evaluation: No evidence of DVT seen on physical exam. Negative Homan's sign. No cords or calf tenderness. No significant calf/ankle edema.    Assessment/Plan: PPD # 2 / 33 y.o., [id number] S/P: SVD and postpartum BTL   Active Problems:   Postpartum care following vaginal delivery (5/4)   Postoperative state    normal postpartum exam  Continue current postpartum care  D/C home   LOS: 2 days   Kenard Gowerolitta M Latrel Szymczak, MSN, CNM 03/05/2014, 9:18 AM

## 2014-03-05 NOTE — Anesthesia Postprocedure Evaluation (Signed)
Anesthesia Post Note  Patient: Nunzio CobbsLakisha Tersigni  Procedure(s) Performed: Procedure(s) (LRB): POST PARTUM TUBAL LIGATION (Bilateral)  Anesthesia type: Epidural  Patient location: Mother/Baby  Post pain: Pain level controlled  Post assessment: Post-op Vital signs reviewed  Last Vitals:  Filed Vitals:   03/05/14 0515  BP: 130/85  Pulse: 81  Temp: 36.7 C  Resp: 18    Post vital signs: Reviewed  Level of consciousness:alert  Complications: No apparent anesthesia complications

## 2014-03-05 NOTE — Discharge Summary (Signed)
POSTOPERATIVE DISCHARGE SUMMARY:  Patient ID: Jennifer [name] MRN: [id number] DOB/AGE: 35-May-1982 35 y.o.  Admit date: 03/03/2014 Admission Diagnoses: Active Labor 2 38.4 wks   Discharge date:   Discharge Diagnoses: Term pregnancy, delivered        S/P PP BTL on 03/04/2014        Prenatal history: [id number]   EDC : 03/13/2014, by ultrasound  [facility] 8.[redacted] wks gestation. Primary provider : Dr. Seymour BarsLavoie Prenatal course complicated by Size vs dates discrepancy  Prenatal Labs: ABO, Rh: --/--/A POS, A POS (05/04 1110)  Antibody: NEG (05/04 1110) Rubella: Immune (10/06 0000)  RPR: NON REAC (05/04 0941)  HBsAg: Negative (10/06 0000)  HIV: Non-reactive (10/06 0000)  GTT : 133 GBS: Negative (04/15 0000)   Medical / Surgical History :  Past medical history:  Past Medical History  Diagnosis Date  . Vaginal Pap smear, abnormal     Past surgical history:  Past Surgical History  Procedure Laterality Date  . No past surgeries    . Tubal ligation Bilateral 03/04/2014    Procedure: POST PARTUM TUBAL LIGATION;  Surgeon: Genia DelMarie-Lyne Lavoie, MD;  Location: [facility];  Service: Gynecology;  Laterality: Bilateral;     Allergies: Latex   Intrapartum Course:  Admitted for active labor @ 38 4/[redacted] weeks gestation / normal progression of labor to complete dilation / SVD of viable female infant by Dr. Cherly Hensenousins / no immediate PP complications   Physical Exam:   VSS: Blood pressure 130/85, pulse 81, temperature 98 F (36.7 C), temperature source Oral, resp. rate 18, height 4' 11.75" (1.518 m), weight 86.818 kg (191 lb 6.4 oz), SpO2 100.00%, unknown if currently breastfeeding.  LABS:  Recent Labs  03/03/14 0941 03/04/14 0610  WBC 8.2 17.8*  HGB 12.8 10.0*  PLT 248 201    Newborn Data Live born female on 03/03/2014 Birth Weight: 7 lb 14.3 oz (3581 g) APGAR: 8, 9  See operative report for further details  Home with mother.  Discharge  Instructions:  Wound Care: keep clean and dry / dermabond adhesive should wear off over 2 weeks period Postpartum Instructions: [facility] discharge booklet - instructions reviewed Medications:    Medication List    STOP taking these medications       promethazine 25 MG tablet  Commonly known as:  PHENERGAN      TAKE these medications       acetaminophen 500 MG tablet  Commonly known as:  TYLENOL  Take 500 mg by mouth every 6 (six) hours as needed for mild pain.     ibuprofen 600 MG tablet  Commonly known as:  ADVIL,MOTRIN  Take 1 tablet (600 mg total) by mouth every 6 (six) hours.     ranitidine 75 MG tablet  Commonly known as:  ZANTAC  Take 75 mg by mouth 2 (two) times daily.           Follow-up Information   Follow up with COUSINS,SHERONETTE A, MD. Schedule an appointment as soon as possible for a visit in 6 weeks. (For postpartum visit)    Specialty:  Obstetrics and Gynecology   Contact information:   [address] [place] [address] [phone]         Signed: Kenard Gowerolitta M Alexxis Mackert, MSN, CNM 03/05/2014, 9:31 AM

## 2014-03-05 NOTE — Addendum Note (Signed)
Addendum created 03/05/14 [id number] by Algis GreenhouseLinda A Hurshel Bouillon, CRNA   Modules edited: Notes Section   Notes Section:  File: [id number]

## 2014-03-05 NOTE — Discharge Instructions (Signed)
Breast Pumping Tips °Pumping your breast milk is a good way to stimulate milk production and have a steady supply of breast milk for your infant. Pumping is most helpful during your infant's growth spurts, when involving dad or a family member, or when you are away. There are several types of pumps available. They can be purchased at a baby or maternity store. You can begin pumping soon after delivery, but some experts believe that you should wait about four weeks to give your infant a bottle. °In general, the more you breastfeed or pump, the more milk you will have for your infant. It is also important to take good care of yourself. This will reduce stress and help your body to create a healthy supply of milk. Your caregiver or lactation consultant can give you the information and support you need in your efforts to breastfeed your infant. °PUMPING BREAST MILK  °Follow the tips below for successful breast pumping. °Take care of yourself. °· Drink enough water or fluids to keep urine clear or pale yellow. You may notice a thirsty feeling while breastfeeding. This is because your body needs more water to make breast milk. Keep a large water bottle handy. Make healthy drink choices such as unsweetened fruit juice, milk and water. Limit soda, coffee, and alcohol (wait 2 hours to feed or pump if you have an alcoholic drink.) °· Eat a healthy, well-balanced diet rich in fruits, vegetables, and whole grains. °· Exercise as recommended by your caregiver. °· Get plenty of sleep. Sleep when your infant sleeps. Ask friends and family for help if you need time to nap or rest. °· Do not smoke. Smoking can lower your milk supply and harm your infant. If you need help quitting, ask your caregiver for a program recommendation. °· Ask your caregiver about birth control options. Birth control pills may lower your milk supply. You may be advised to use condoms or other forms of birth control. °Relax and pump °Stimulating your  let-down reflex is the key to successful and effective pumping. This makes the milk in all parts of the breast flow more freely.  °· It is easier to pump breast milk (and breastfeed) while you are relaxed. Find techniques that work for you. Quiet private spaces, breast massage, soothing heat placed on the breast, music, and pictures or a tape recording of your infant may help you to relax and "let down" your milk. If you have difficulty with your let down, try smelling one of your infant's blankets or an item of clothing he or she has worn while you are pumping. °· When pumping, place the special suction cup (flange) directly over the nipple. It may be uncomfortable and cause nipple damage if it is not placed properly or is the wrong size. Applying a small amount of purified or modified lanolin to your nipple and the areola may help increase your comfort level. Also, you can change the speed and suction of many electric pumps to your comfort level. Your caregiver or lactation consultant can help you with this. °· If pumping continues to be painful, or you feel you are not getting very much milk when you pump, you may need a different type of pump. A lactation consultant can help you determine if this is the case. °· If you are with your infant, feed him or her on demand and try pumping after each feeding. This will boost your production, even if milk does not come out. You may not be able   to pump much milk at first, but keep up the routine, and this will change. °· If you are working or away from your infant for several hours, try pumping for about 15 minutes every 2 to 3 hours. Pump both breasts at the same time if you can. °· If your infant has a formula feeding, make sure you pump your milk around the same time to maintain your supply. °· Begin pumping breast milk a few weeks before you return to work. This will help you develop techniques that work for you and will be able to store extra milk. °· Find a source  of breastfeeding information that works well for you. °TIPS FOR STORING BREAST MILK °· Store breast milk in a sealable sterile bag, jar, or container provided with your pumping supplies. °· Store milk in small amounts close to what your infant is drinking at each feeding. °· Cool pumped milk in a refrigerator or cooler. Pumped milk can last at the back of the refrigerator for 3 to 8 days. °· Place cooled milk at the back of the freezer for up to 3 months. °· Thaw the milk in its container or bag in warm water up to 24 hours in advance. Do not use a microwave to thaw or heat milk. Do not refreeze the milk after it has been thawed. °· Breast milk is safe to drink when left at room temperature (mid 70s or colder) for 4 to 8 hours. After that, throw it away. °· Milk fat can separate and look funny. The color can vary slightly from day to day. This is normal. Always shake the milk before using it to mix the fat with the more watery portion. °SEEK MEDICAL CARE IF:  °· You are having trouble pumping or feeding your infant. °· You are concerned that you are not making enough milk. °· You have nipple pain, soreness, or redness. °· You have other questions or concerns related to you or your infant. °Document Released: 04/06/2010 Document Revised: 01/09/2012 Document Reviewed: 04/06/2010 °[employer]® Patient Information ©2014 [employer]. °Postpartum Depression and Baby Blues °The postpartum period begins right after the birth of a baby. During this time, there is often a great amount of joy and excitement. It is also a time of considerable changes in the life of the parent(s). Regardless of how many times a mother gives birth, each child brings new challenges and dynamics to the family. It is not unusual to have feelings of excitement accompanied by confusing shifts in moods, emotions, and thoughts. All mothers are at risk of developing postpartum depression or the "baby blues." These mood changes can occur right after giving  birth, or they may occur many months after giving birth. The baby blues or postpartum depression can be mild or severe. Additionally, postpartum depression can resolve rather quickly, or it can be a long-term condition. °CAUSES °Elevated hormones and their rapid decline are thought to be a main cause of postpartum depression and the baby blues. There are a number of hormones that radically change during and after pregnancy. Estrogen and progesterone usually decrease immediately after delivering your baby. The level of thyroid hormone and various cortisol steroids also rapidly drop. Other factors that play a major role in these changes include major life events and genetics.  °RISK FACTORS °If you have any of the following risks for the baby blues or postpartum depression, know what symptoms to watch out for during the postpartum period. Risk factors that may increase the likelihood of   getting the baby blues or postpartum depression include: °· Having a personal or family history of depression. °· Having depression while being pregnant. °· Having premenstrual or oral contraceptive-associated mood issues. °· Having exceptional life stress. °· Having marital conflict. °· Lacking a social support network. °· Having a baby with special needs. °· Having health problems such as diabetes. °SYMPTOMS °Baby blues symptoms include: °· Brief fluctuations in mood, such as going from extreme happiness to sadness. °· Decreased concentration. °· Difficulty sleeping. °· Crying spells, tearfulness. °· Irritability. °· Anxiety. °Postpartum depression symptoms typically begin within the first month after giving birth. These symptoms include: °· Difficulty sleeping or excessive sleepiness. °· Marked weight loss. °· Agitation. °· Feelings of worthlessness. °· Lack of interest in activity or food. °Postpartum psychosis is a very concerning condition and can be dangerous. Fortunately, it is rare. Displaying any of the following symptoms is  cause for immediate medical attention. Postpartum psychosis symptoms include: °· Hallucinations and delusions. °· Bizarre or disorganized behavior. °· Confusion or disorientation. °DIAGNOSIS  °A diagnosis is made by an evaluation of your symptoms. There are no medical or lab tests that lead to a diagnosis, but there are various questionnaires that a caregiver may use to identify those with the baby blues, postpartum depression, or psychosis. Often times, a screening tool called the Edinburgh Postnatal Depression Scale is used to diagnose depression in the postpartum period.  °TREATMENT °The baby blues usually goes away on its own in 1 to 2 weeks. Social support is often all that is needed. You should be encouraged to get adequate sleep and rest. Occasionally, you may be given medicines to help you sleep.  °Postpartum depression requires treatment as it can last several months or longer if it is not treated. Treatment may include individual or group therapy, medicine, or both to address any social, physiological, and psychological factors that may play a role in the depression. Regular exercise, a healthy diet, rest, and social support may also be strongly recommended.  °Postpartum psychosis is more serious and needs treatment right away. Hospitalization is often needed. °HOME CARE INSTRUCTIONS °· Get as much rest as you can. Nap when the baby sleeps. °· Exercise regularly. Some women find yoga and walking to be beneficial. °· Eat a balanced and nourishing diet. °· Do little things that you enjoy. Have a cup of tea, take a bubble bath, read your favorite magazine, or listen to your favorite music. °· Avoid alcohol. °· Ask for help with household chores, cooking, grocery shopping, or running errands as needed. Do not try to do everything. °· Talk to people close to you about how you are feeling. Get support from your partner, family members, friends, or other new moms. °· Try to stay positive in how you think. Think  about the things you are grateful for. °· Do not spend a lot of time alone. °· Only take medicine as directed by your caregiver. °· Keep all your postpartum appointments. °· Let your caregiver know if you have any concerns. °SEEK MEDICAL CARE IF: °You are having a reaction or problems with your medicine. °SEEK IMMEDIATE MEDICAL CARE IF: °· You have suicidal feelings. °· You feel you may harm the baby or someone else. °Document Released: 07/21/2004 Document Revised: 01/09/2012 Document Reviewed: 07/29/2013 °[employer]® Patient Information ©2014 [employer]. °Breastfeeding and Mastitis °Mastitis is inflammation of the breast tissue. It can occur in women who are breastfeeding. This can make breastfeeding painful. Mastitis will sometimes go away on its own. Your   health care provider will help determine if treatment is needed. °CAUSES °Mastitis is often associated with a blocked milk (lactiferous) duct. This can happen when too much milk builds up in the breast. Causes of excess milk in the breast can include: °· Poor latch-on. If your baby is not latched onto the breast properly, she or he may not empty your breast completely while breastfeeding. °· Allowing too much time to pass between feedings. °· Wearing a bra or other clothing that is too tight. This puts extra pressure on the lactiferous ducts so milk does not flow through them as it should. °Mastitis can also be caused by a bacterial infection. Bacteria may enter the breast tissue through cuts or openings in the skin. In women who are breastfeeding, this may occur because of cracked or irritated skin. Cracks in the skin are often caused when your baby does not latch on properly to the breast. °SIGNS AND SYMPTOMS °· Swelling, redness, tenderness, and pain in an area of the breast. °· Swelling of the glands under the arm on the same side. °· Fever may or may not accompany mastitis. °If an infection is allowed to progress, a collection of pus (abscess) may  develop. °DIAGNOSIS  °Your health care provider can usually diagnose mastitis based on your symptoms and a physical exam. Tests may be done to help confirm the diagnosis. These may include: °· Removal of pus from the breast by applying pressure to the area. This pus can be examined in the lab to determine which bacteria are present. If an abscess has developed, the fluid in the abscess can be removed with a needle. This can also be used to confirm the diagnosis and determine the bacteria present. In most cases, pus will not be present. °· Blood tests to determine if your body is fighting a bacterial infection. °· Mammogram or ultrasound tests to rule out other problems or diseases. °TREATMENT  °Mastitis that occurs with breastfeeding will sometimes go away on its own. Your health care provider may choose to wait 24 hours after first seeing you to decide whether a prescription medicine is needed. If your symptoms are worse after 24 hours, your health care provider will likely prescribe an antibiotic to treat the mastitis. He or she will determine which bacteria are most likely causing the infection and will then select an appropriate antibiotic. This is sometimes changed based on the results of tests performed to identify the bacteria, or if there is no response to the antibiotic selected. Antibiotics are usually given by mouth. You may also be given medicine for pain. °HOME CARE INSTRUCTIONS °· Only take over-the-counter or prescription medicines for pain, fever, or discomfort as directed by your health care provider. °· If your health care provider prescribed an antibiotic, take the medicine as directed. Make sure you finish it even if you start to feel better. °· Do not wear a tight or underwire bra. Wear a soft, supportive bra. °· Increase your fluid intake, especially if you have a fever. °· Continue to empty the breast. Your health care provider can tell you whether this milk is safe for your infant or needs to  be thrown out. You may be told to stop nursing until your health care provider thinks it is safe for your baby. Use a breast pump if you are advised to stop nursing. °· Keep your nipples clean and dry. °· Empty the first breast completely before going to the other breast. If your baby is not emptying   your breasts completely for some reason, use a breast pump to empty your breasts. °· If you go back to work, pump your breasts while at work to stay in time with your nursing schedule. °· Avoid allowing your breasts to become overly filled with milk (engorged). °SEEK MEDICAL CARE IF: °· You have pus-like discharge from the breast. °· Your symptoms do not improve with the treatment prescribed by your health care provider within 2 days. °SEEK IMMEDIATE MEDICAL CARE IF: °· Your pain and swelling are getting worse. °· You have pain that is not controlled with medicine. °· You have a red line extending from the breast toward your armpit. °· You have a fever or persistent symptoms for more than 2 3 days. °· You have a fever and your symptoms suddenly get worse. °MAKE SURE YOU:  °· Understand these instructions. °· Will watch your condition. °· Will get help right away if you are not doing well or get worse. °Document Released: 02/11/2005 Document Revised: 06/19/2013 Document Reviewed: 05/23/2013 °[employer]® Patient Information ©2014 [employer]. °Breastfeeding °Deciding to breastfeed is one of the best choices you can make for you and your baby. A change in hormones during pregnancy causes your breast tissue to grow and increases the number and size of your milk ducts. These hormones also allow proteins, sugars, and fats from your blood supply to make breast milk in your milk-producing glands. Hormones prevent breast milk from being released before your baby is born as well as prompt milk flow after birth. Once breastfeeding has begun, thoughts of your baby, as well as his or her sucking or crying, can stimulate the release  of milk from your milk-producing glands.  °BENEFITS OF BREASTFEEDING °For Your Baby °· Your first milk (colostrum) helps your baby's digestive system function better.   °· There are antibodies in your milk that help your baby fight off infections.   °· Your baby has a lower incidence of asthma, allergies, and sudden infant death syndrome.   °· The nutrients in breast milk are better for your baby than infant formulas and are designed uniquely for your baby's needs.   °· Breast milk improves your baby's brain development.   °· Your baby is less likely to develop other conditions, such as childhood obesity, asthma, or type 2 diabetes mellitus.   °For You  °· Breastfeeding helps to create a very special bond between you and your baby.   °· Breastfeeding is convenient. Breast milk is always available at the correct temperature and costs nothing.   °· Breastfeeding helps to burn calories and helps you lose the weight gained during pregnancy.   °· Breastfeeding makes your uterus contract to its prepregnancy size faster and slows bleeding (lochia) after you give birth.   °· Breastfeeding helps to lower your risk of developing type 2 diabetes mellitus, osteoporosis, and breast or ovarian cancer later in life. °SIGNS THAT YOUR BABY IS HUNGRY °Early Signs of Hunger  °· Increased alertness or activity. °· Stretching. °· Movement of the head from side to side. °· Movement of the head and opening of the mouth when the corner of the mouth or cheek is stroked (rooting). °· Increased sucking sounds, smacking lips, cooing, sighing, or squeaking. °· Hand-to-mouth movements. °· Increased sucking of fingers or hands. °Late Signs of Hunger °· Fussing. °· Intermittent crying. °Extreme Signs of Hunger °Signs of extreme hunger will require calming and consoling before your baby will be able to breastfeed successfully. Do not wait for the following signs of extreme hunger to occur before you initiate breastfeeding:   °·   Restlessness. °· A  loud, strong cry. °·  Screaming. °BREASTFEEDING BASICS °Breastfeeding Initiation °· Find a comfortable place to sit or lie down, with your neck and back well supported. °· Place a pillow or rolled up blanket under your baby to bring him or her to the level of your breast (if you are seated). Nursing pillows are specially designed to help support your arms and your baby while you breastfeed. °· Make sure that your baby's abdomen is facing your abdomen.   °· Gently massage your breast. With your fingertips, massage from your chest wall toward your nipple in a circular motion. This encourages milk flow. You may need to continue this action during the feeding if your milk flows slowly. °· Support your breast with 4 fingers underneath and your thumb above your nipple. Make sure your fingers are well away from your nipple and your baby's mouth.   °· Stroke your baby's lips gently with your finger or nipple.   °· When your baby's mouth is open wide enough, quickly bring your baby to your breast, placing your entire nipple and as much of the colored area around your nipple (areola) as possible into your baby's mouth.   °· More areola should be visible above your baby's upper lip than below the lower lip.   °· Your baby's tongue should be between his or her lower gum and your breast.   °· Ensure that your baby's mouth is correctly positioned around your nipple (latched). Your baby's lips should create a seal on your breast and be turned out (everted). °· It is common for your baby to suck about 2 3 minutes in order to start the flow of breast milk. °Latching °Teaching your baby how to latch on to your breast properly is very important. An improper latch can cause nipple pain and decreased milk supply for you and poor weight gain in your baby. Also, if your baby is not latched onto your nipple properly, he or she may swallow some air during feeding. This can make your baby fussy. Burping your baby when you switch breasts  during the feeding can help to get rid of the air. However, teaching your baby to latch on properly is still the best way to prevent fussiness from swallowing air while breastfeeding. °Signs that your baby has successfully latched on to your nipple:    °· Silent tugging or silent sucking, without causing you pain.   °· Swallowing heard between every 3 4 sucks.   °·  Muscle movement above and in front of his or her ears while sucking.   °Signs that your baby has not successfully latched on to nipple:  °· Sucking sounds or smacking sounds from your baby while breastfeeding. °· Nipple pain. °If you think your baby has not latched on correctly, slip your finger into the corner of your baby's mouth to break the suction and place it between your baby's gums. Attempt breastfeeding initiation again. °Signs of Successful Breastfeeding °Signs from your baby:   °· A gradual decrease in the number of sucks or complete cessation of sucking.   °· Falling asleep.   °· Relaxation of his or her body.   °· Retention of a small amount of milk in his or her mouth.   °· Letting go of your breast by himself or herself. °Signs from you: °· Breasts that have increased in firmness, weight, and size 1 3 hours after feeding.   °· Breasts that are softer immediately after breastfeeding. °· Increased milk volume, as well as a change in milk consistency and color by the   5th day of breastfeeding.   °· Nipples that are not sore, cracked, or bleeding. °Signs That Your Baby is Getting Enough Milk °· Wetting at least 3 diapers in a 24-hour period. The urine should be clear and pale yellow by age 5 days. °· At least 3 stools in a 24-hour period by age 5 days. The stool should be soft and yellow. °· At least 3 stools in a 24-hour period by age 7 days. The stool should be seedy and yellow. °· No loss of weight greater than 10% of birth weight during the first 3 days of age. °· Average weight gain of 4 7 ounces (120 210 mL) per week after age 4  days. °· Consistent daily weight gain by age 5 days, without weight loss after the age of 2 weeks. °After a feeding, your baby may spit up a small amount. This is common. °BREASTFEEDING FREQUENCY AND DURATION °Frequent feeding will help you make more milk and can prevent sore nipples and breast engorgement. Breastfeed when you feel the need to reduce the fullness of your breasts or when your baby shows signs of hunger. This is called "breastfeeding on demand." Avoid introducing a pacifier to your baby while you are working to establish breastfeeding (the first 4 6 weeks after your baby is born). After this time you may choose to use a pacifier. Research has shown that pacifier use during the first year of a baby's life decreases the risk of sudden infant death syndrome (SIDS). °Allow your baby to feed on each breast as long as he or she wants. Breastfeed until your baby is finished feeding. When your baby unlatches or falls asleep while feeding from the first breast, offer the second breast. Because newborns are often sleepy in the first few weeks of life, you may need to awaken your baby to get him or her to feed. °Breastfeeding times will vary from baby to baby. However, the following rules can serve as a guide to help you ensure that your baby is properly fed: °· Newborns (babies 4 weeks of age or younger) may breastfeed every 1 3 hours. °· Newborns should not go longer than 3 hours during the day or 5 hours during the night without breastfeeding. °· You should breastfeed your baby a minimum of 8 times in a 24-hour period until you begin to introduce solid foods to your baby at around 6 months of age. °BREAST MILK PUMPING °Pumping and storing breast milk allows you to ensure that your baby is exclusively fed your breast milk, even at times when you are unable to breastfeed. This is especially important if you are going back to work while you are still breastfeeding or when you are not able to be present during  feedings. Your lactation consultant can give you guidelines on how long it is safe to store breast milk.  °A breast pump is a machine that allows you to pump milk from your breast into a sterile bottle. The pumped breast milk can then be stored in a refrigerator or freezer. Some breast pumps are operated by hand, while others use electricity. Ask your lactation consultant which type will work best for you. Breast pumps can be purchased, but some hospitals and breastfeeding support groups lease breast pumps on a monthly basis. A lactation consultant can teach you how to hand express breast milk, if you prefer not to use a pump.  °CARING FOR YOUR BREASTS WHILE YOU BREASTFEED °Nipples can become dry, cracked, and sore while breastfeeding.   The following recommendations can help keep your breasts moisturized and healthy: °· Avoid using soap on your nipples.   °· Wear a supportive bra. Although not required, special nursing bras and tank tops are designed to allow access to your breasts for breastfeeding without taking off your entire bra or top. Avoid wearing underwire style bras or extremely tight bras. °· Air dry your nipples for 3 4 minutes after each feeding.   °· Use only cotton bra pads to absorb leaked breast milk. Leaking of breast milk between feedings is normal.   °· Use lanolin on your nipples after breastfeeding. Lanolin helps to maintain your skin's normal moisture barrier. If you use pure lanolin you do not need to wash it off before feeding your baby again. Pure lanolin is not toxic to your baby. You may also hand express a few drops of breast milk and gently massage that milk into your nipples and allow the milk to air dry. °In the first few weeks after giving birth, some women experience extremely full breasts (engorgement). Engorgement can make your breasts feel heavy, warm, and tender to the touch. Engorgement peaks within 3 5 days after you give birth. The following recommendations can help ease  engorgement: °· Completely empty your breasts while breastfeeding or pumping. You may want to start by applying warm, moist heat (in the shower or with warm water-soaked hand towels) just before feeding or pumping. This increases circulation and helps the milk flow. If your baby does not completely empty your breasts while breastfeeding, pump any extra milk after he or she is finished. °· Wear a snug bra (nursing or regular) or tank top for 1 2 days to signal your body to slightly decrease milk production. °· Apply ice packs to your breasts, unless this is too uncomfortable for you. °· Make sure that your baby is latched on and positioned properly while breastfeeding. °If engorgement persists after 48 hours of following these recommendations, contact your health care provider or a lactation consultant. °OVERALL HEALTH CARE RECOMMENDATIONS WHILE BREASTFEEDING °· Eat healthy foods. Alternate between meals and snacks, eating 3 of each per day. Because what you eat affects your breast milk, some of the foods may make your baby more irritable than usual. Avoid eating these foods if you are sure that they are negatively affecting your baby. °· Drink milk, fruit juice, and water to satisfy your thirst (about 10 glasses a day).   °· Rest often, relax, and continue to take your prenatal vitamins to prevent fatigue, stress, and anemia. °· Continue breast self-awareness checks. °· Avoid chewing and smoking tobacco. °· Avoid alcohol and drug use. °Some medicines that may be harmful to your baby can pass through breast milk. It is important to ask your health care provider before taking any medicine, including all over-the-counter and prescription medicine as well as vitamin and herbal supplements. °It is possible to become pregnant while breastfeeding. If birth control is desired, ask your health care provider about options that will be safe for your baby. °SEEK MEDICAL CARE IF:  °· You feel like you want to stop breastfeeding  or have become frustrated with breastfeeding. °· You have painful breasts or nipples. °· Your nipples are cracked or bleeding. °· Your breasts are red, tender, or warm. °· You have a swollen area on either breast. °· You have a fever or chills. °· You have nausea or vomiting. °· You have drainage other than breast milk from your nipples. °· Your breasts do not become full before feedings by   the 5th day after you give birth. °· You feel sad and depressed. °· Your baby is too sleepy to eat well. °· Your baby is having trouble sleeping.   °· Your baby is wetting less than 3 diapers in a 24-hour period. °· Your baby has less than 3 stools in a 24-hour period. °· Your baby's skin or the white part of his or her eyes becomes yellow.   °· Your baby is not gaining weight by 5 days of age. °SEEK IMMEDIATE MEDICAL CARE IF:  °· Your baby is overly tired (lethargic) and does not want to wake up and feed. °· Your baby develops an unexplained fever. °Document Released: 10/17/2005 Document Revised: 06/19/2013 Document Reviewed: 04/10/2013 °ExitCare® Patient Information ©2014 [employer]. ° °

## 2014-03-06 ENCOUNTER — Inpatient Hospital Stay (HOSPITAL_COMMUNITY): Admission: RE | Admit: 2014-03-06 | Payer: BC Managed Care – PPO | Source: Ambulatory Visit

## 2014-03-06 ENCOUNTER — Encounter (HOSPITAL_COMMUNITY): Payer: BC Managed Care – PPO

## 2014-09-01 ENCOUNTER — Encounter (HOSPITAL_COMMUNITY): Payer: Self-pay | Admitting: Obstetrics & Gynecology

## 2017-05-10 DIAGNOSIS — R350 Frequency of micturition: Secondary | ICD-10-CM | POA: Diagnosis not present

## 2017-05-10 DIAGNOSIS — Z01419 Encounter for gynecological examination (general) (routine) without abnormal findings: Secondary | ICD-10-CM | POA: Diagnosis not present

## 2017-12-20 DIAGNOSIS — R1032 Left lower quadrant pain: Secondary | ICD-10-CM | POA: Diagnosis not present

## 2017-12-20 DIAGNOSIS — N946 Dysmenorrhea, unspecified: Secondary | ICD-10-CM | POA: Diagnosis not present

## 2018-01-05 DIAGNOSIS — N644 Mastodynia: Secondary | ICD-10-CM | POA: Diagnosis not present

## 2018-01-05 DIAGNOSIS — N6310 Unspecified lump in the right breast, unspecified quadrant: Secondary | ICD-10-CM | POA: Diagnosis not present

## 2018-01-08 ENCOUNTER — Other Ambulatory Visit: Payer: Self-pay | Admitting: Physician Assistant

## 2018-01-08 DIAGNOSIS — N644 Mastodynia: Secondary | ICD-10-CM

## 2018-01-08 DIAGNOSIS — N6310 Unspecified lump in the right breast, unspecified quadrant: Secondary | ICD-10-CM

## 2018-01-16 ENCOUNTER — Ambulatory Visit
Admission: RE | Admit: 2018-01-16 | Discharge: 2018-01-16 | Disposition: A | Discharge status: Home / Self Care | Payer: 59 | Source: Ambulatory Visit | Attending: Physician Assistant | Admitting: Physician Assistant

## 2018-01-16 ENCOUNTER — Other Ambulatory Visit: Payer: Self-pay | Admitting: Physician Assistant

## 2018-01-16 DIAGNOSIS — N6321 Unspecified lump in the left breast, upper outer quadrant: Secondary | ICD-10-CM | POA: Diagnosis not present

## 2018-01-16 DIAGNOSIS — N6310 Unspecified lump in the right breast, unspecified quadrant: Secondary | ICD-10-CM

## 2018-01-16 DIAGNOSIS — R922 Inconclusive mammogram: Secondary | ICD-10-CM | POA: Diagnosis not present

## 2018-01-16 DIAGNOSIS — N644 Mastodynia: Secondary | ICD-10-CM

## 2018-01-16 DIAGNOSIS — N6312 Unspecified lump in the right breast, upper inner quadrant: Secondary | ICD-10-CM | POA: Diagnosis not present

## 2018-06-18 DIAGNOSIS — E049 Nontoxic goiter, unspecified: Secondary | ICD-10-CM | POA: Diagnosis not present

## 2018-06-18 DIAGNOSIS — Z1159 Encounter for screening for other viral diseases: Secondary | ICD-10-CM | POA: Diagnosis not present

## 2018-06-18 DIAGNOSIS — Z01419 Encounter for gynecological examination (general) (routine) without abnormal findings: Secondary | ICD-10-CM | POA: Diagnosis not present

## 2018-07-16 DIAGNOSIS — E010 Iodine-deficiency related diffuse (endemic) goiter: Secondary | ICD-10-CM | POA: Diagnosis not present

## 2018-07-16 DIAGNOSIS — L659 Nonscarring hair loss, unspecified: Secondary | ICD-10-CM | POA: Diagnosis not present

## 2018-07-16 DIAGNOSIS — N920 Excessive and frequent menstruation with regular cycle: Secondary | ICD-10-CM | POA: Diagnosis not present

## 2018-07-17 ENCOUNTER — Other Ambulatory Visit: Payer: Self-pay | Admitting: Family Medicine

## 2018-07-17 DIAGNOSIS — E010 Iodine-deficiency related diffuse (endemic) goiter: Secondary | ICD-10-CM

## 2018-07-23 ENCOUNTER — Other Ambulatory Visit: Payer: 59

## 2018-07-25 ENCOUNTER — Ambulatory Visit
Admission: RE | Admit: 2018-07-25 | Discharge: 2018-07-25 | Disposition: A | Discharge status: Home / Self Care | Payer: 59 | Source: Ambulatory Visit | Attending: Family Medicine | Admitting: Family Medicine

## 2018-07-25 DIAGNOSIS — E010 Iodine-deficiency related diffuse (endemic) goiter: Secondary | ICD-10-CM

## 2018-07-25 DIAGNOSIS — E049 Nontoxic goiter, unspecified: Secondary | ICD-10-CM | POA: Diagnosis not present

## 2019-05-15 ENCOUNTER — Other Ambulatory Visit: Payer: Self-pay | Admitting: *Deleted

## 2019-05-15 DIAGNOSIS — Z20822 Contact with and (suspected) exposure to covid-19: Secondary | ICD-10-CM

## 2019-05-19 LAB — NOVEL CORONAVIRUS, NAA: SARS-CoV-2, NAA: DETECTED — AB

## 2019-05-20 ENCOUNTER — Telehealth: Payer: Self-pay | Admitting: Critical Care Medicine

## 2019-05-20 NOTE — Telephone Encounter (Signed)
I connected with Ms. Timberlake who had a positive COVID test from July 15 and a [facility] testing event  The patient has minimal symptoms at this time and I explained to her she needs to stay in isolation for 10 days which would and the first of next week on July 27 from when she began becoming ill  The patient states her job site is requesting to negative COVID test before she can return to work and she knows her options for community testing also her primary care doctor will follow-up with her

## 2019-06-23 IMAGING — US US THYROID
1 series · 13 of 25 positions shown · non-contrast
Comparison: None.

CLINICAL DATA: Palpable abnormality.  Thyroid enlargement.

EXAM:
THYROID ULTRASOUND
TECHNIQUE: Ultrasound examination of the thyroid gland and adjacent soft
tissues was performed.

[Series 1: us thyroid · 0.07mm/px · 13 of 44 slices shown]
[im 1/44]
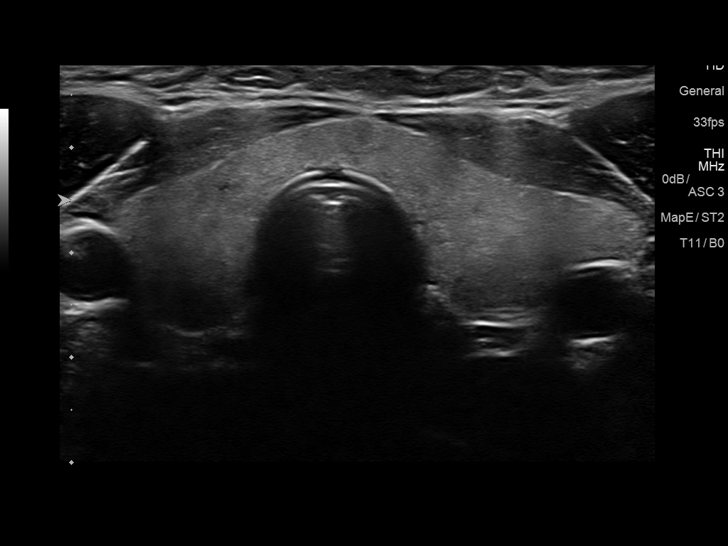
[im 4/44]
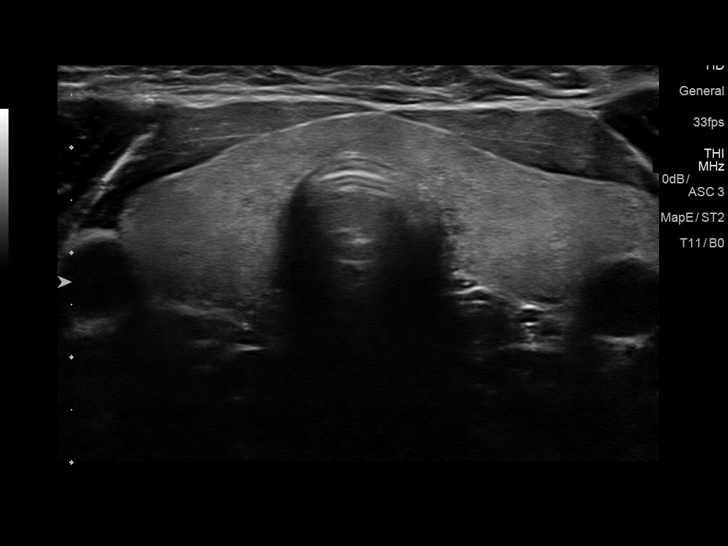
[im 8/44]
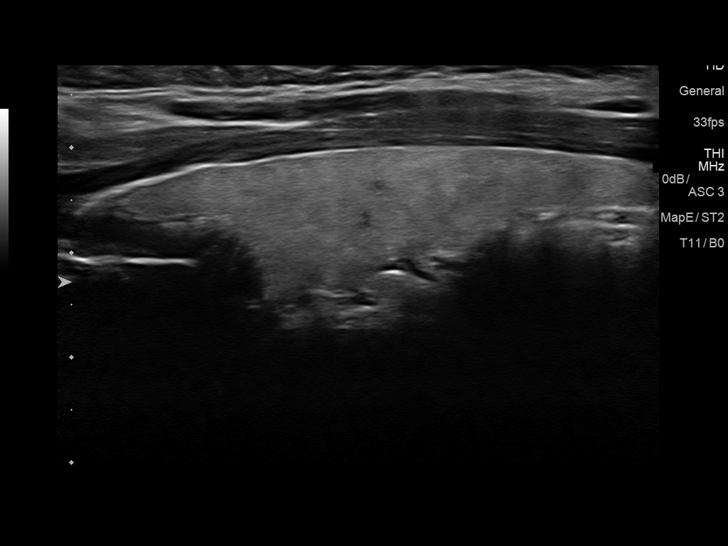
[im 11/44]
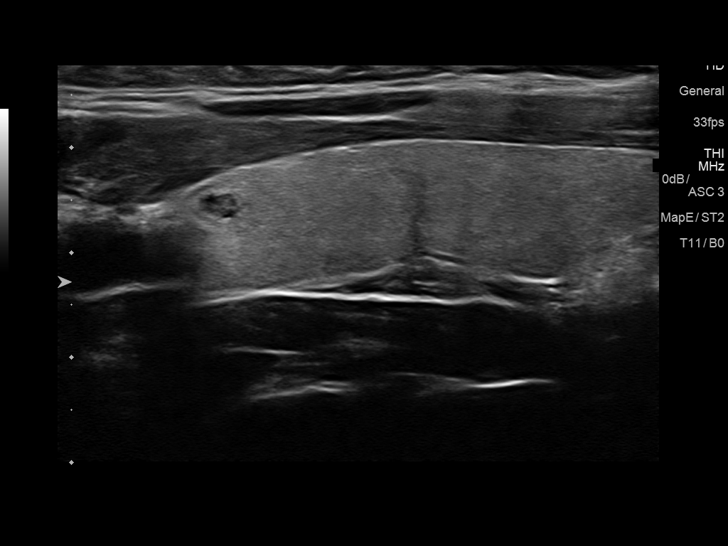
[im 15/44]
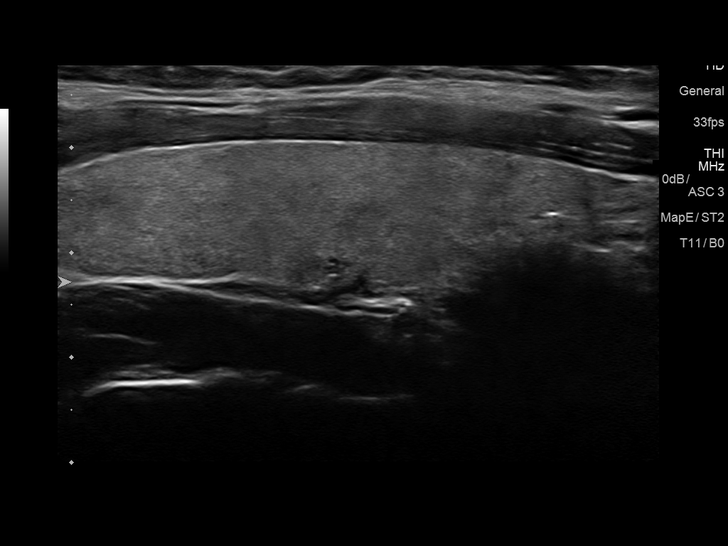
[im 18/44]
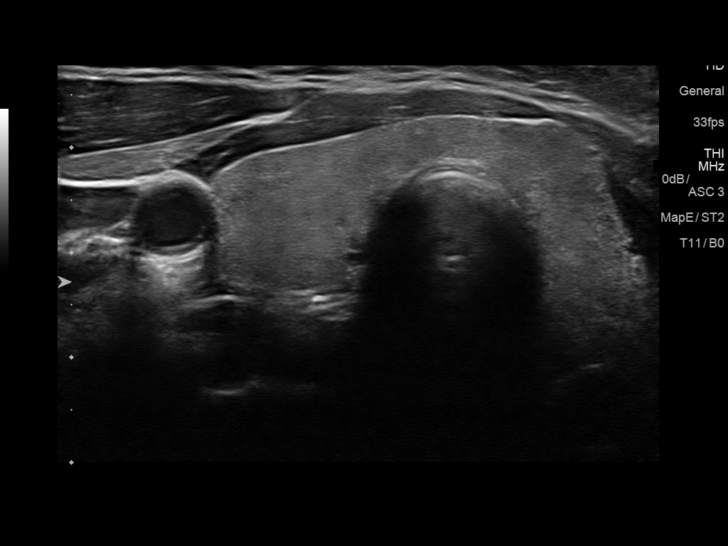
[im 22/44]
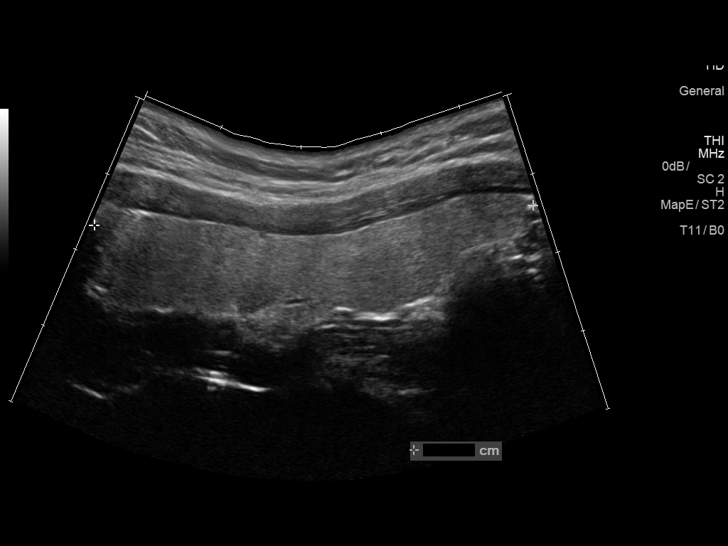
[im 26/44]
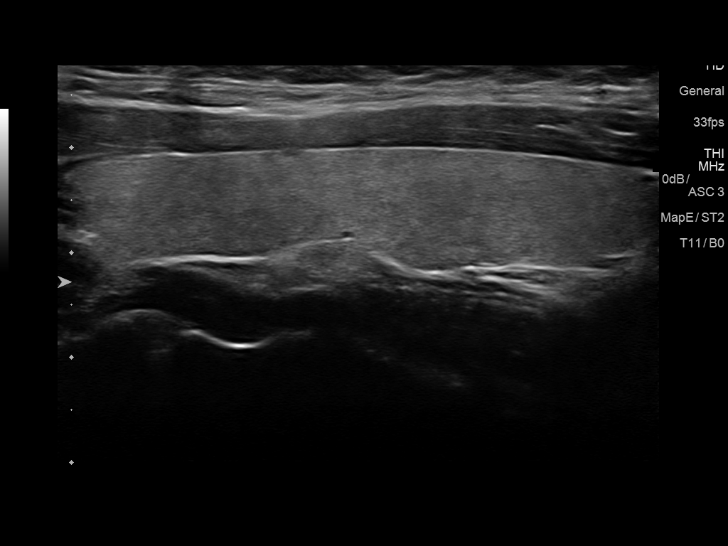
[im 29/44]
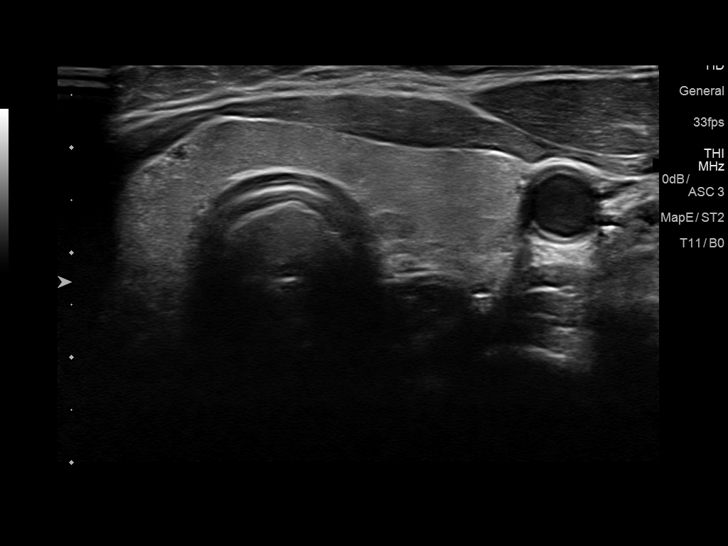
[im 33/44]
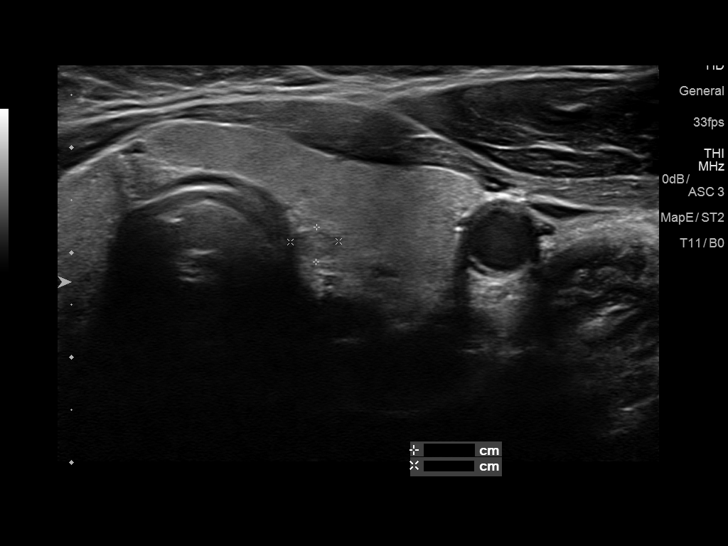
[im 36/44]
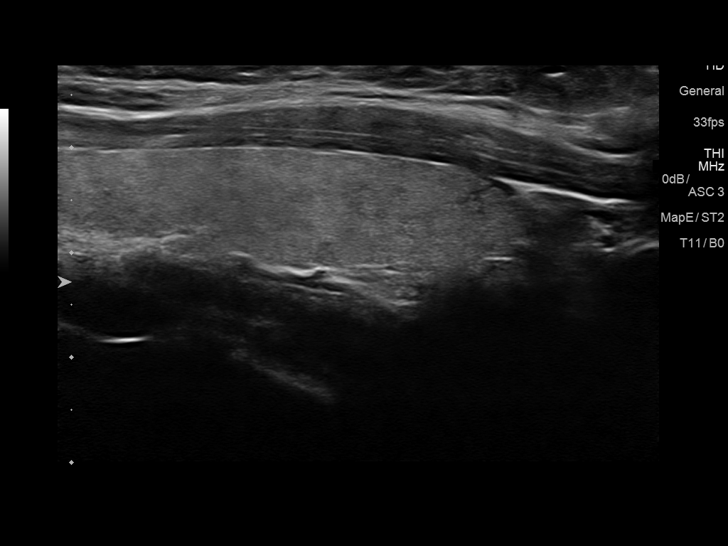
[im 40/44]
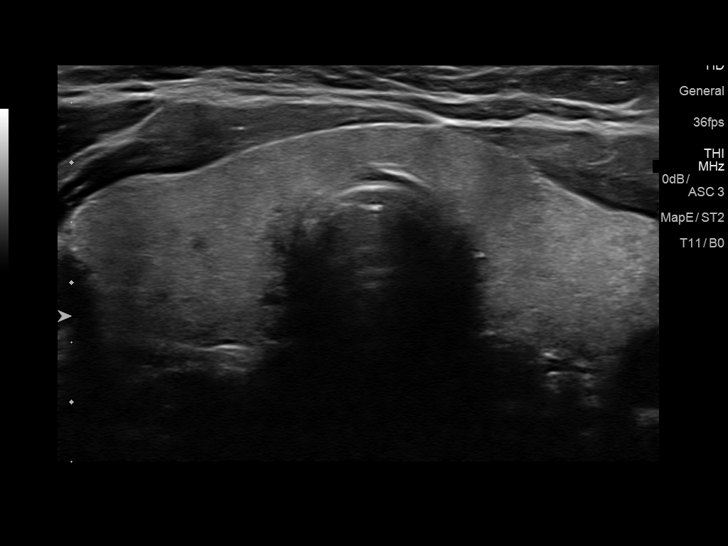
[im 44/44]
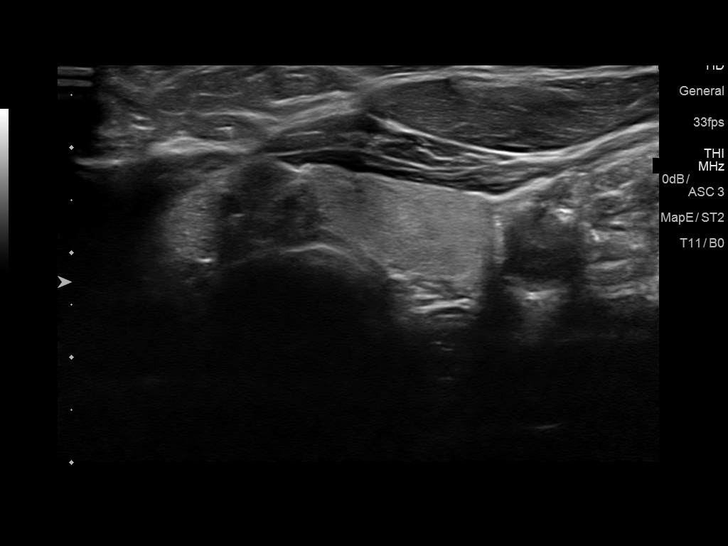

[13 of 25 positions shown; findings below may reference images not displayed]

FINDINGS: Parenchymal Echotexture: Normal

Isthmus: 0.5 cm

Right lobe: 5.4 x 1.4 x 1.6 cm

Left lobe: 5.3 x 1.2 x 1.4 cm

_________________________________________________________

Estimated total number of nodules >/= 1 cm: 0

Number of spongiform nodules >/=  2 cm not described below (TR1): 0

Number of mixed cystic and solid nodules >/= 1.5 cm not described
below (TR2): 0

_________________________________________________________

There are no definite nodules within the thyroid gland which meet
criteria for biopsy nor follow-up

There is a 1.0 x 0.3 x 0.5 cm isoechoic nodule posterior to the
midportion of the left lobe of the thyroid gland.
IMPRESSION: There are no thyroid nodules which meet criteria for biopsy nor
follow-up

The gland is at the upper limits of normal limits in size.

There is a 1.0 x 0.3 x 0.5 cm soft tissue nodule posterior to the
left lobe of the thyroid gland. This may be a parathyroid nodule.
Correlation with laboratory analysis may be helpful.

The above is in keeping with the ACR TI-RADS recommendations - [HOSPITAL] 5194;[DATE].

## 2020-08-24 ENCOUNTER — Other Ambulatory Visit: Payer: Self-pay | Admitting: Obstetrics and Gynecology

## 2020-08-24 DIAGNOSIS — N63 Unspecified lump in unspecified breast: Secondary | ICD-10-CM

## 2020-08-25 ENCOUNTER — Other Ambulatory Visit: Payer: Self-pay | Admitting: Obstetrics and Gynecology

## 2020-08-25 DIAGNOSIS — N63 Unspecified lump in unspecified breast: Secondary | ICD-10-CM

## 2020-10-13 ENCOUNTER — Other Ambulatory Visit: Payer: 59

## 2020-10-15 ENCOUNTER — Other Ambulatory Visit: Payer: Self-pay | Admitting: Obstetrics and Gynecology

## 2020-10-15 ENCOUNTER — Other Ambulatory Visit: Payer: Self-pay

## 2020-10-15 ENCOUNTER — Ambulatory Visit
Admission: RE | Admit: 2020-10-15 | Discharge: 2020-10-15 | Disposition: A | Discharge status: Home / Self Care | Payer: 59 | Source: Ambulatory Visit | Attending: Obstetrics and Gynecology | Admitting: Obstetrics and Gynecology

## 2020-10-15 DIAGNOSIS — N63 Unspecified lump in unspecified breast: Secondary | ICD-10-CM

## 2020-11-10 ENCOUNTER — Other Ambulatory Visit: Payer: No Typology Code available for payment source

## 2021-02-02 ENCOUNTER — Other Ambulatory Visit: Payer: Self-pay

## 2021-02-02 ENCOUNTER — Encounter: Payer: Self-pay | Admitting: *Deleted

## 2021-02-02 ENCOUNTER — Ambulatory Visit (INDEPENDENT_AMBULATORY_CARE_PROVIDER_SITE_OTHER): Payer: No Typology Code available for payment source | Admitting: Internal Medicine

## 2021-02-02 ENCOUNTER — Encounter: Payer: Self-pay | Admitting: Internal Medicine

## 2021-02-02 ENCOUNTER — Ambulatory Visit (INDEPENDENT_AMBULATORY_CARE_PROVIDER_SITE_OTHER): Payer: No Typology Code available for payment source

## 2021-02-02 VITALS — BP 120/90 | HR 71 | Ht 59.0 in | Wt 170.0 lb

## 2021-02-02 DIAGNOSIS — R002 Palpitations: Secondary | ICD-10-CM

## 2021-02-02 DIAGNOSIS — Z8616 Personal history of COVID-19: Secondary | ICD-10-CM | POA: Diagnosis not present

## 2021-02-02 DIAGNOSIS — I10 Essential (primary) hypertension: Secondary | ICD-10-CM | POA: Diagnosis not present

## 2021-02-02 DIAGNOSIS — E782 Mixed hyperlipidemia: Secondary | ICD-10-CM

## 2021-02-02 NOTE — Patient Instructions (Signed)
Medication Instructions:  Your physician recommends that you continue on your current medications as directed. Please refer to the Current Medication list given to you today.  *If you need a refill on your cardiac medications before your next appointment, please call your pharmacy*   Lab Work: NONE If you have labs (blood work) drawn today and your tests are completely normal, you will receive your results only by: Marland Kitchen MyChart Message (if you have MyChart) OR . A paper copy in the mail If you have any lab test that is abnormal or we need to change your treatment, we will call you to review the results.   Testing/Procedures: Your physician has requested that you wear a 14 day heart monitor.    Follow-Up: At [facility], you and your health needs are our priority.  As part of our continuing mission to provide you with exceptional heart care, we have created designated Provider Care Teams.  These Care Teams include your primary Cardiologist (physician) and Advanced Practice Providers (APPs -  Physician Assistants and Nurse Practitioners) who all work together to provide you with the care you need, when you need it.  We recommend signing up for the patient portal called "MyChart".  Sign up information is provided on this After Visit Summary.  MyChart is used to connect with patients for Virtual Visits (Telemedicine).  Patients are able to view lab/test results, encounter notes, upcoming appointments, etc.  Non-urgent messages can be sent to your provider as well.   To learn more about what you can do with MyChart, go to [email or web address].    Your next appointment:   6-7 month(s)  The format for your next appointment:   In Person  Provider:   You may see Christell Constant, MD or one of the following Advanced Practice Providers on your designated Care Team:    Ronie Spies, PA-C  Jacolyn Reedy, PA-C    Other Instructions  Jennifer [name]- Long Term Monitor Instructions   Your  physician has requested you wear your ZIO patch monitor___14____days.   This is a single patch monitor.  [employer] supplies one patch monitor per enrollment.  Additional stickers are not available.   Please do not apply patch if you will be having a Nuclear Stress Test, Echocardiogram, Cardiac CT, MRI, or Chest Xray during the time frame you would be wearing the monitor. The patch cannot be worn during these tests.  You cannot remove and re-apply the ZIO XT patch monitor.   Your ZIO patch monitor will be sent USPS Priority mail from [employer] directly to your home address. The monitor may also be mailed to a PO BOX if home delivery is not available.   It may take 3-5 days to receive your monitor after you have been enrolled.   Once you have received you monitor, please review enclosed instructions.  Your monitor has already been registered assigning a specific monitor serial # to you.   Applying the monitor   Shave hair from upper left chest.   Hold abrader disc by orange tab.  Rub abrader in 40 strokes over left upper chest as indicated in your monitor instructions.   Clean area with 4 enclosed alcohol pads .  Use all pads to assure are is cleaned thoroughly.  Let dry.   Apply patch as indicated in monitor instructions.  Patch will be place under collarbone on left side of chest with arrow pointing upward.   Rub patch adhesive wings for 2 minutes.Remove white label marked "  1".  Remove white label marked "2".  Rub patch adhesive wings for 2 additional minutes.   While looking in a mirror, press and release button in center of patch.  A small green light will flash 3-4 times .  This will be your only indicator the monitor has been turned on.     Do not shower for the first 24 hours.  You may shower after the first 24 hours.   Press button if you feel a symptom. You will hear a small click.  Record Date, Time and Symptom in the Patient Log Book.   When you are ready to remove  patch, follow instructions on last 2 pages of Patient Log Book.  Stick patch monitor onto last page of Patient Log Book.   Place Patient Log Book in [place] box.  Use locking tab on box and tape box closed securely.  The Orange and [employer] has [employer] on it.  Please place in mailbox as soon as possible.  Your physician should have your test results approximately 7 days after the monitor has been mailed back to [facility].   Call [facility] Customer Care at [phone] if you have questions regarding your ZIO XT patch monitor.  Call them immediately if you see an orange light blinking on your monitor.   If your monitor falls off in less than 4 days contact our Monitor department at [phone].  If your monitor becomes loose or falls off after 4 days call [employer] at [phone] for suggestions on securing your monitor.

## 2021-02-02 NOTE — Progress Notes (Addendum)
Cardiology Office Note:    Date:  02/02/2021   ID:  Jennifer [name], DOB 25-Dec-1980, MRN [id number]  PCP:  Orbie Hurst Peter Minium   [facility]  Cardiologist:  Christell Constant, MD  Advanced Practice Provider:  No care team member to display Electrophysiologist:  None       CC: Palpitations Consulted for the evaluation of palpitations at the behest of Scifres, Dorothy, [place]  History of Present Illness:    Jennifer [name] is a 40 y.o. female with history of COVID- 19 infection (2020, 2022), HLD and HTN who presents for evaluation 02/02/21. [Addendum changed COVID-10 to COVID-19]  Patient notes that she is feeling after her second COVID-19 infection, she has been having nocturnal palpitations. Pre-Covid had no medications . After COVID-19 infection 1st infection, had elevated BP. Has had no chest pain, chest pressure, chest tightness, chest stinging .  Discomfort occurs with with short bursts at night, worsens with no triggers, and improves with no inteerventions.  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  No shortness of breath, DOE with only extreme activity (can work out with the girls without issues) .  No PND or orthopnea.  No bendopnea, weight gain, leg swelling , or abdominal swelling.  No syncope or near syncope, but notes that her vertigo has been worse since both COVID-19 infections.  Patient reports NO prior cardiac testing including  echo,  stress test,  heart catheterizations,  cardioversion,  ablations.  No history of pre-eclampsia, early menarche, or prematurity (though she is unsure).   Ambulatory BP DBP 70s-80s..   Past Medical History:  Diagnosis Date  . Vaginal Pap smear, abnormal     Past Surgical History:  Procedure Laterality Date  . NO PAST SURGERIES    . TUBAL LIGATION Bilateral 03/04/2014   Procedure: POST PARTUM TUBAL LIGATION;  Surgeon: Genia Del, MD;  Location: [facility];   Service: Gynecology;  Laterality: Bilateral;    Current Medications: Current Meds  Medication Sig  . acetaminophen (TYLENOL) 500 MG tablet Take 500 mg by mouth every 6 (six) hours as needed for mild pain.  Marland Kitchen amLODipine (NORVASC) 2.5 MG tablet Take 2.5 mg by mouth daily.  . hydrochlorothiazide (HYDRODIURIL) 12.5 MG tablet Take 12.5 mg by mouth daily.  Marland Kitchen ibuprofen (ADVIL,MOTRIN) 600 MG tablet Take 1 tablet (600 mg total) by mouth every 6 (six) hours.  . meclizine (ANTIVERT) 12.5 MG tablet as needed.  . pantoprazole (PROTONIX) 40 MG tablet Take 1 tablet by mouth daily.  . tranexamic acid (LYSTEDA) 650 MG TABS tablet Take 1,300 mg by mouth as needed.     Allergies:   Latex   Social History   Socioeconomic History  . Marital status: Single    Spouse name: Not on file  . Number of children: Not on file  . Years of education: Not on file  . Highest education level: Not on file  Occupational History  . Not on file  Tobacco Use  . Smoking status: Former Smoker    Quit date: 09/27/2013    Years since quitting: 7.3  . Smokeless tobacco: Never Used  Vaping Use  . Vaping Use: Never used  Substance and Sexual Activity  . Alcohol use: No  . Drug use: No  . Sexual activity: Yes    Birth control/protection: None  Other Topics Concern  . Not on file  Social History Narrative  . Not on file   Social  Determinants of Health   Financial Resource Strain: Not on file  Food Insecurity: Not on file  Transportation Needs: Not on file  Physical Activity: Not on file  Stress: Not on file  Social Connections: Not on file     Family History: The patient's family history includes Heart disease in her paternal uncle; Hypertension in her paternal grandmother; Stroke in her paternal uncle. History of coronary artery disease notable for no members. History of heart failure notable for no members. History of arrhythmia notable for no members.   ROS:   Please see the history of present  illness.     All other systems reviewed and are negative.  EKGs/Labs/Other Studies Reviewed:    The following studies were reviewed today:  EKG:  EKG is  ordered today.  The ekg ordered today demonstrates  02/02/21: SR 71 WNL  Recent Labs: No results found for requested labs within last 8760 hours.  Recent Lipid Panel No results found for: CHOL, TRIG, HDL, CHOLHDL, VLDL, LDLCALC, LDLDIRECT   Risk Assessment/Calculations:     N/A  Physical Exam:    VS:  BP 120/90   Pulse 71   Ht 4\' 11"  (1.499 m)   Wt 170 lb (77.1 kg)   SpO2 98%   BMI 34.34 kg/m     Wt Readings from Last 3 Encounters:  02/02/21 170 lb (77.1 kg)  03/03/14 191 lb 6.4 oz (86.8 kg)  11/30/13 186 lb (84.4 kg)     GEN:  Well nourished, well developed in no acute distress HEENT: Normal NECK: No JVD; No carotid bruits LYMPHATICS: No lymphadenopathy CARDIAC: RRR, no murmurs, rubs, gallops RESPIRATORY:  Clear to auscultation without rales, wheezing or rhonchi  ABDOMEN: Soft, non-tender, non-distended MUSCULOSKELETAL:  No edema; No deformity  SKIN: Warm and dry NEUROLOGIC:  Alert and oriented x 3 PSYCHIATRIC:  Normal affect   ASSESSMENT:    1. Palpitations   2. History of COVID-19   3. Essential hypertension   4. Mixed hyperlipidemia    PLAN:    In order of problems listed above:  Palpitations; possible SVT History of COVID-19 - will obtain 14-day non live heart monitor (ZioPatch)   Essential Hypertension HLD with 2.5% ASCVD - ambulatory blood pressure DBP at goal, will continue ambulatory BP monitoring; gave education on how to perform ambulatory blood pressure monitoring including the frequency and technique; goal ambulatory blood pressure < 135/85 on average - continue home medications (norvasc and HCTZ) - discussed diet (DASH/low sodium), and exercise/weight loss interventions for HLD as welll  Summer/Fall follow up unless new symptoms or abnormal test results warranting change in  plan  Would be reasonable for  Video Visit Follow up Would be reasonable for  APP Follow up       Medication Adjustments/Labs and Tests Ordered: Current medicines are reviewed at length with the patient today.  Concerns regarding medicines are outlined above.  Orders Placed This Encounter  Procedures  . LONG TERM MONITOR (3-14 DAYS)  . EKG 12-Lead   No orders of the defined types were placed in this encounter.   Patient Instructions  Medication Instructions:  Your physician recommends that you continue on your current medications as directed. Please refer to the Current Medication list given to you today.  *If you need a refill on your cardiac medications before your next appointment, please call your pharmacy*   Lab Work: NONE If you have labs (blood work) drawn today and your tests are completely normal, you will receive your  results only by: Marland Kitchen MyChart Message (if you have MyChart) OR . A paper copy in the mail If you have any lab test that is abnormal or we need to change your treatment, we will call you to review the results.   Testing/Procedures: Your physician has requested that you wear a 14 day heart monitor.    Follow-Up: At [facility], you and your health needs are our priority.  As part of our continuing mission to provide you with exceptional heart care, we have created designated Provider Care Teams.  These Care Teams include your primary Cardiologist (physician) and Advanced Practice Providers (APPs -  Physician Assistants and Nurse Practitioners) who all work together to provide you with the care you need, when you need it.  We recommend signing up for the patient portal called "MyChart".  Sign up information is provided on this After Visit Summary.  MyChart is used to connect with patients for Virtual Visits (Telemedicine).  Patients are able to view lab/test results, encounter notes, upcoming appointments, etc.  Non-urgent messages can be sent to your  provider as well.   To learn more about what you can do with MyChart, go to [email or web address].    Your next appointment:   6-7 month(s)  The format for your next appointment:   In Person  Provider:   You may see Christell Constant, MD or one of the following Advanced Practice Providers on your designated Care Team:    Ronie Spies, PA-C  Jacolyn Reedy, PA-C    Other Instructions  Christena Deem- Long Term Monitor Instructions   Your physician has requested you wear your ZIO patch monitor___14____days.   This is a single patch monitor.  [employer] supplies one patch monitor per enrollment.  Additional stickers are not available.   Please do not apply patch if you will be having a Nuclear Stress Test, Echocardiogram, Cardiac CT, MRI, or Chest Xray during the time frame you would be wearing the monitor. The patch cannot be worn during these tests.  You cannot remove and re-apply the ZIO XT patch monitor.   Your ZIO patch monitor will be sent USPS Priority mail from [facility] directly to your home address. The monitor may also be mailed to a PO BOX if home delivery is not available.   It may take 3-5 days to receive your monitor after you have been enrolled.   Once you have received you monitor, please review enclosed instructions.  Your monitor has already been registered assigning a specific monitor serial # to you.   Applying the monitor   Shave hair from upper left chest.   Hold abrader disc by orange tab.  Rub abrader in 40 strokes over left upper chest as indicated in your monitor instructions.   Clean area with 4 enclosed alcohol pads .  Use all pads to assure are is cleaned thoroughly.  Let dry.   Apply patch as indicated in monitor instructions.  Patch will be place under collarbone on left side of chest with arrow pointing upward.   Rub patch adhesive wings for 2 minutes.Remove white label marked "1".  Remove white label marked "2".  Rub patch adhesive wings  for 2 additional minutes.   While looking in a mirror, press and release button in center of patch.  A small green light will flash 3-4 times .  This will be your only indicator the monitor has been turned on.     Do not shower for the first 24 hours.  You may shower after the first 24 hours.   Press button if you feel a symptom. You will hear a small click.  Record Date, Time and Symptom in the Patient Log Book.   When you are ready to remove patch, follow instructions on last 2 pages of Patient Log Book.  Stick patch monitor onto last page of Patient Log Book.   Place Patient Log Book in [place] box.  Use locking tab on box and tape box closed securely.  The [employer] and [employer] has [employer] on it.  Please place in mailbox as soon as possible.  Your physician should have your test results approximately 7 days after the monitor has been mailed back to [facility].   Call [facility] Customer Care at [phone] if you have questions regarding your ZIO XT patch monitor.  Call them immediately if you see an orange light blinking on your monitor.   If your monitor falls off in less than 4 days contact our Monitor department at [phone].  If your monitor becomes loose or falls off after 4 days call [employer] at [phone] for suggestions on securing your monitor.       Signed, Christell Constant, MD  02/02/2021 12:16 PM    [facility]

## 2021-02-02 NOTE — Progress Notes (Signed)
Patient ID: Jennifer [name], female   DOB: 03-22-1981, 40 y.o.   MRN: [id number] Patient enrolled for [employer] to ship a 14 day ZIO XT monitor to her home.

## 2021-02-04 DIAGNOSIS — R002 Palpitations: Secondary | ICD-10-CM

## 2021-03-27 IMAGING — US US BREAST*R* LIMITED INC AXILLA
1 series · 8 of 8 positions shown · non-contrast
Comparison: Previous exam(s).

CLINICAL DATA: 39-year-old female presenting for delayed follow-up
of bilateral breast masses.

EXAM:
DIGITAL DIAGNOSTIC BILATERAL MAMMOGRAM WITH TOMO AND CAD; ULTRASOUND
RIGHT BREAST LIMITED; ULTRASOUND LEFT BREAST LIMITED

[Series 1: us breast*right* limited inc axilla · 0.09mm/px · 8 of 8 slices shown]
[im 1/8]
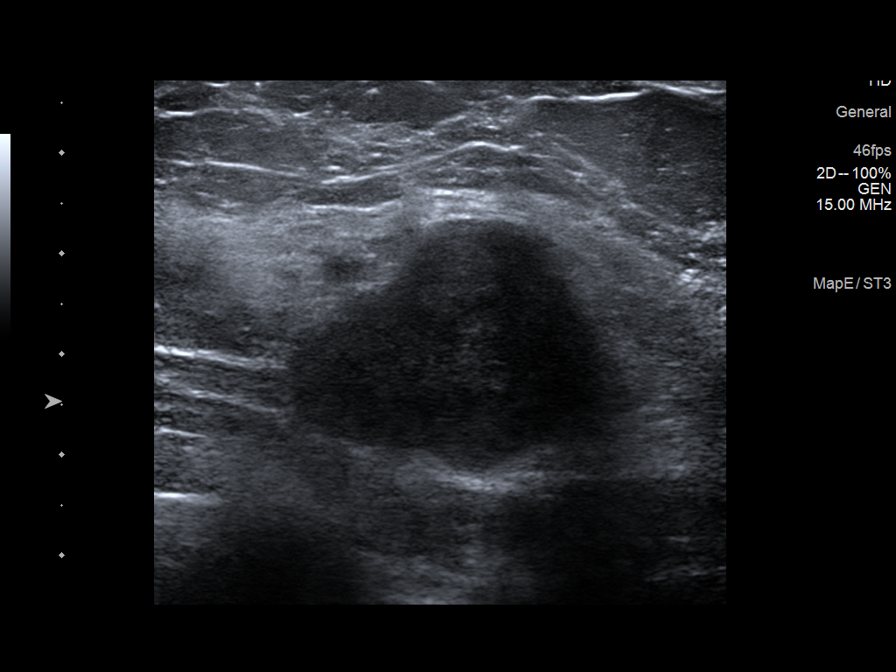
[im 2/8]
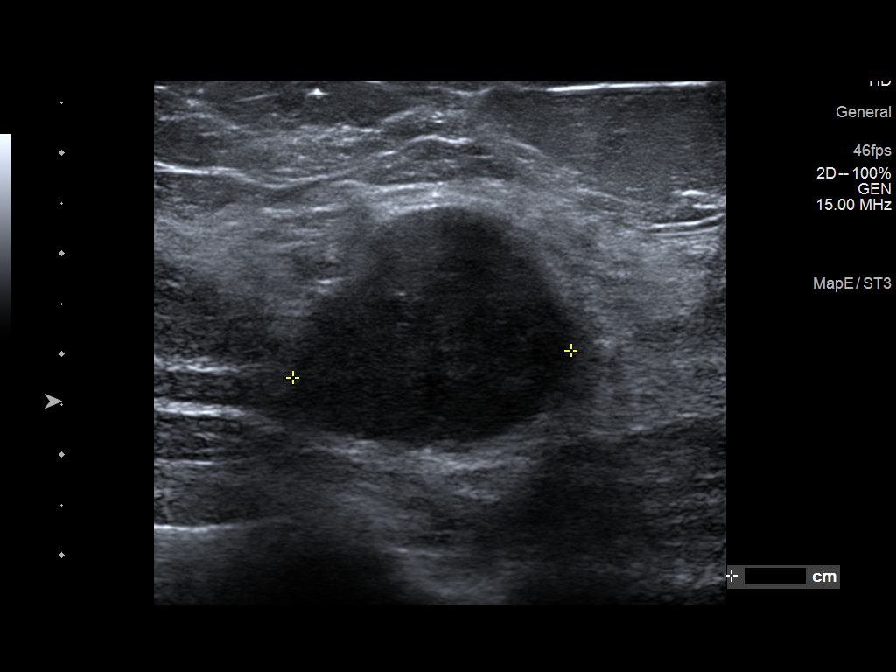
[im 3/8]
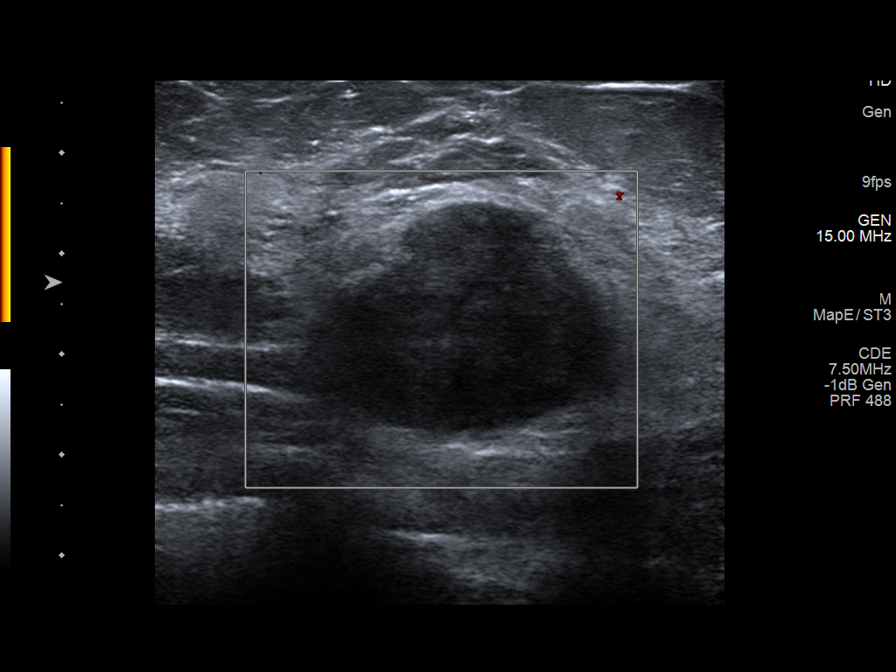
[im 4/8]
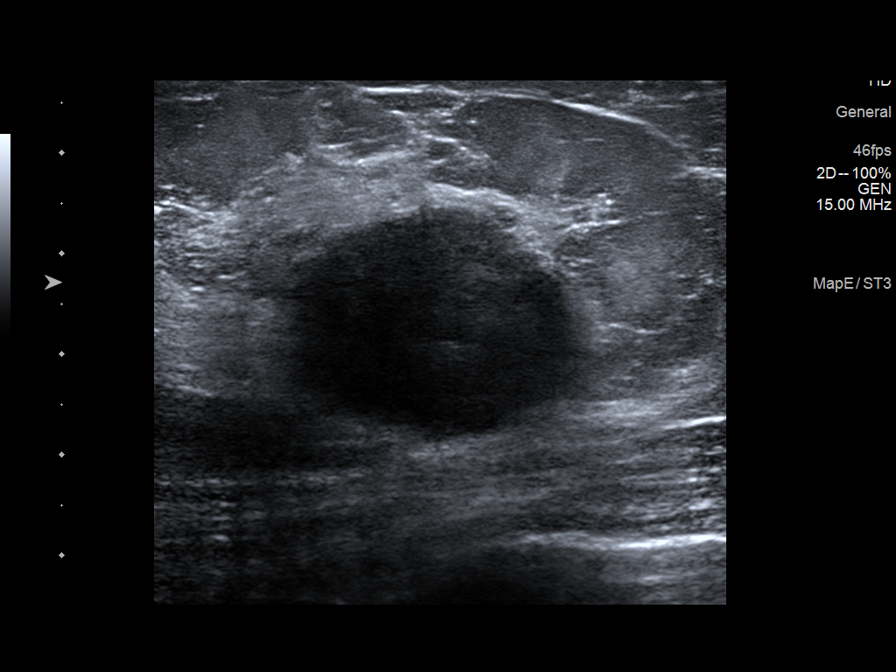
[im 5/8]
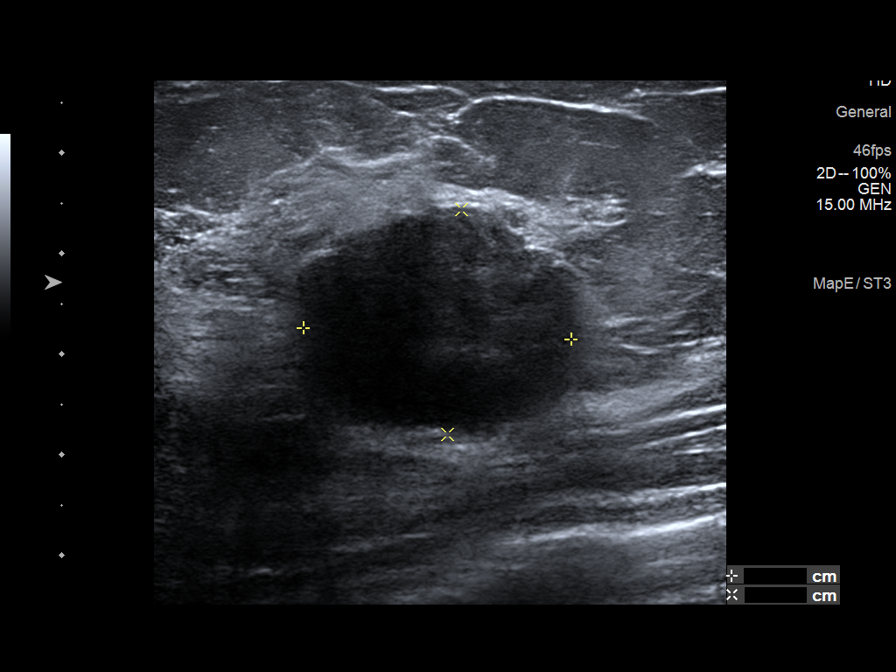
[im 6/8]
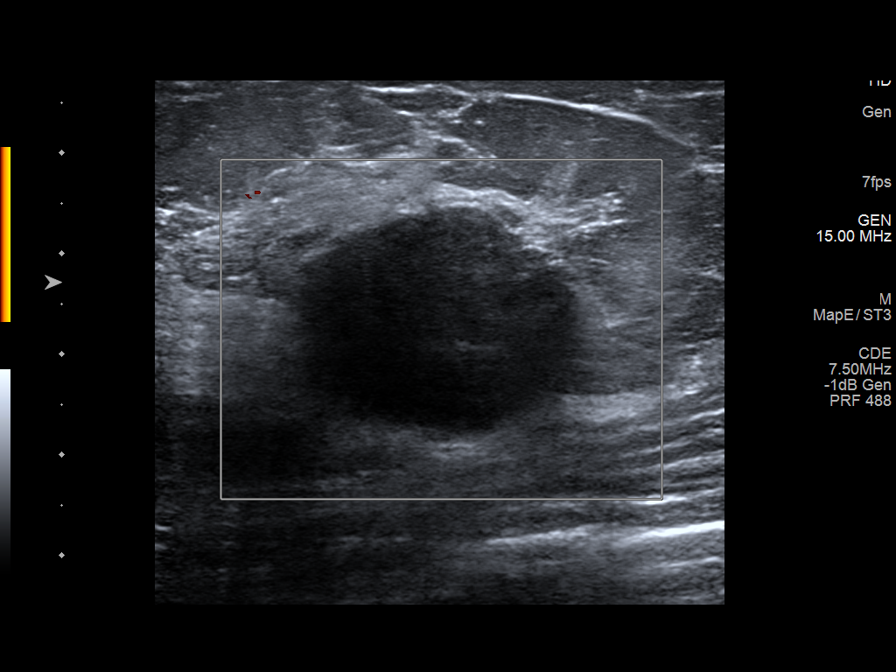
[im 7/8]
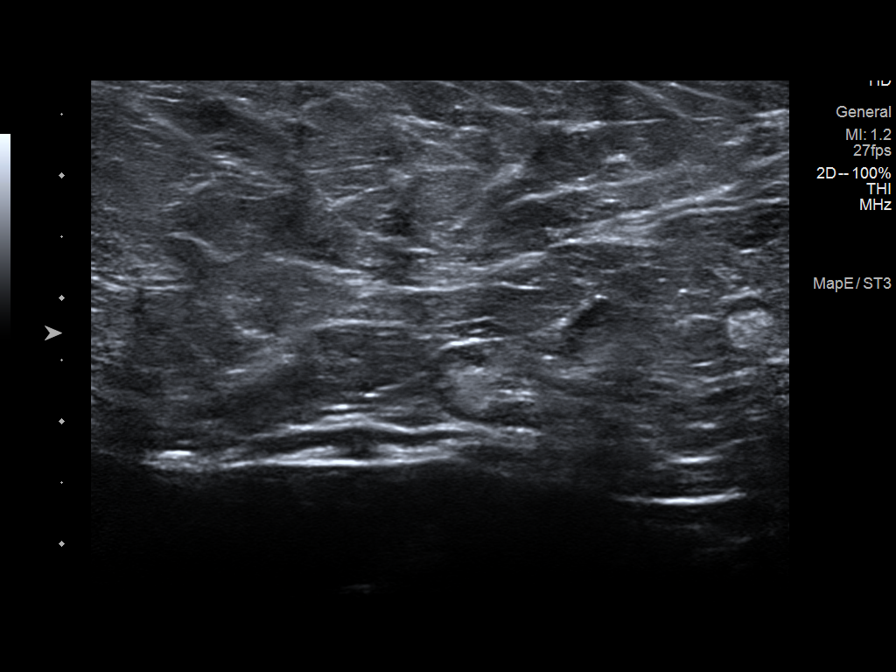
[im 8/8]
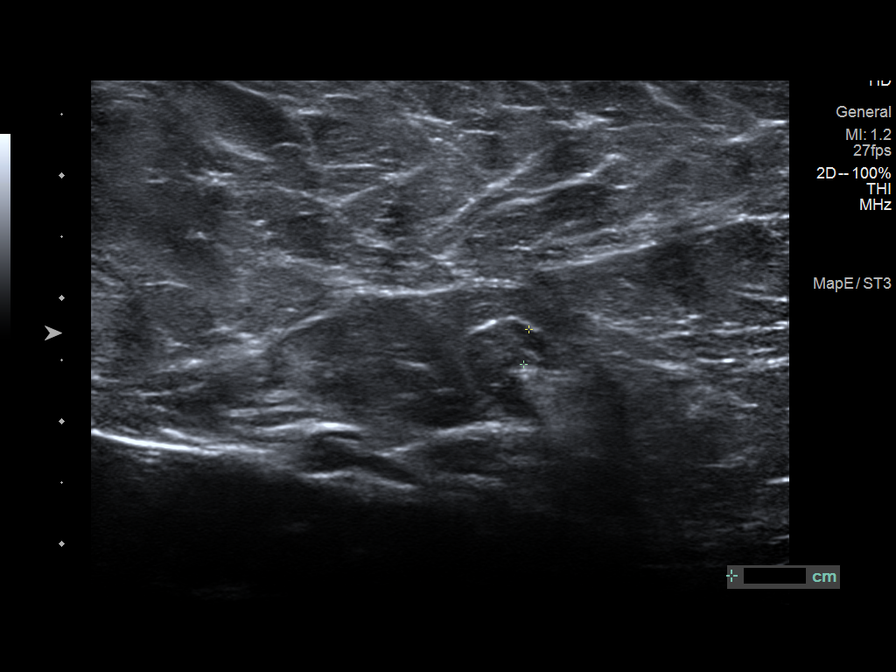

[8 of 8 positions shown; findings below may reference images not displayed]

ACR Breast Density Category c: The breast tissue is heterogeneously
dense, which may obscure small masses.
FINDINGS: The mass in the upper inner middle depth of the right breast has
increased in size. The mass appears to be oval, and is obscured. In
the lateral to lower outer quadrant of the left breast, posterior
depth, there is an oval obscured mass, which appears to be larger
than on the comparison exam. No other suspicious calcifications,
masses or areas of distortion are seen in the bilateral breasts.

Mammographic images were processed with CAD.

Ultrasound of the right breast at 1 o'clock, 1 cm from the nipple
demonstrates a large hypoechoic oval mass with indistinct margins
measuring 2.8 x 2.2 x 2.7 cm, previously measuring 1.7 x 1.1 x
cm in Friday December, 2017.

Ultrasound of the right axilla demonstrates multiple
normal-appearing lymph nodes.

Ultrasound of the left breast at 2 o'clock, 3 cm from the nipple
demonstrates resolution of the mass, which previously measuring
x 0.4 x 0.8 cm.

Ultrasound of the left breast at [DATE], 1 cm from the nipple
demonstrates an irregular hypoechoic possibly intraductal mass
measuring 0.8 x 0.3 x 0.5 cm.

The mass previously seen mass at [DATE], 2 cm from the nipple measures
0.5 x 0.3 cm, previously measuring 0.6 x 0.4 x 0.5 cm.

Ultrasound of the left breast at 4 o'clock, 4 cm from the nipple
demonstrates an oval circumscribed mass measuring 2.0 x 0.9 x
cm.

Ultrasound of the left axilla demonstrates multiple normal-appearing
lymph nodes.
IMPRESSION: 1. There is a suspicious 2.8 cm mass in the right breast at 1
o'clock.

2. There is a possible intraductal 0.8 cm mass in the left breast at
[DATE].

3. There is an indeterminate 2.0 cm mass in the left breast at 4
o'clock.

4. The mass previously seen in the left breast at 2 o'clock has
resolved, and the previously seen mass at [DATE], 2 cm from the nipple
has demonstrated 2 years of stability, and is therefore benign.

5.  No evidence of bilateral axillary lymphadenopathy.

RECOMMENDATION:
Ultrasound guided biopsy is recommended for the right breast mass at
1 o'clock, the left breast mass at 230 and the left breast mass at 4
o'clock. The procedures have been scheduled for 11/10/2020 at [DATE]
p.m.

I have discussed the findings and recommendations with the patient.
If applicable, a reminder letter will be sent to the patient
regarding the next appointment.

BI-RADS CATEGORY  4: Suspicious.

## 2021-08-06 ENCOUNTER — Emergency Department (HOSPITAL_COMMUNITY)
Admission: EM | Admit: 2021-08-06 | Discharge: 2021-08-06 | Discharge status: Against Medical Advice | Payer: No Typology Code available for payment source

## 2021-08-06 NOTE — ED Notes (Signed)
Patient called for triage x3, failed to answer, pulled OTF.
